# Patient Record
Sex: Female | Born: 1960 | Race: White | Hispanic: No | Marital: Married | Smoking: Never smoker
Health system: Southern US, Community
[De-identification: ages and names within clinical notes are randomized; demographics above are authoritative.]

## PROBLEM LIST (undated history)

## (undated) DIAGNOSIS — C801 Malignant (primary) neoplasm, unspecified: Secondary | ICD-10-CM

## (undated) DIAGNOSIS — K439 Ventral hernia without obstruction or gangrene: Secondary | ICD-10-CM

## (undated) HISTORY — PX: BREAST BIOPSY: SHX20

## (undated) HISTORY — PX: COLONOSCOPY: SHX174

## (undated) HISTORY — PX: DILATION AND CURETTAGE OF UTERUS: SHX78

---
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
## 2006-09-10 HISTORY — PX: EYE SURGERY: SHX253

## 2008-12-09 ENCOUNTER — Ambulatory Visit (HOSPITAL_COMMUNITY): Admission: RE | Admit: 2008-12-09 | Discharge: 2008-12-09 | Payer: Self-pay | Admitting: Obstetrics & Gynecology

## 2008-12-09 ENCOUNTER — Encounter (INDEPENDENT_AMBULATORY_CARE_PROVIDER_SITE_OTHER): Payer: Self-pay | Admitting: Obstetrics & Gynecology

## 2009-09-10 HISTORY — PX: DIAGNOSTIC LAPAROSCOPY: SUR761

## 2009-09-10 HISTORY — PX: OTHER SURGICAL HISTORY: SHX169

## 2009-11-18 ENCOUNTER — Ambulatory Visit (HOSPITAL_COMMUNITY): Admission: RE | Admit: 2009-11-18 | Discharge: 2009-11-18 | Payer: Self-pay | Admitting: Obstetrics & Gynecology

## 2010-08-10 HISTORY — PX: OTHER SURGICAL HISTORY: SHX169

## 2010-12-04 LAB — TYPE AND SCREEN
ABO/RH(D): B POS
Antibody Screen: NEGATIVE

## 2010-12-04 LAB — CBC
HCT: 40 % (ref 36.0–46.0)
Hemoglobin: 13.5 g/dL (ref 12.0–15.0)
MCHC: 33.8 g/dL (ref 30.0–36.0)
MCV: 92.4 fL (ref 78.0–100.0)
Platelets: 211 10*3/uL (ref 150–400)
WBC: 5.4 10*3/uL (ref 4.0–10.5)

## 2010-12-20 LAB — GLUCOSE, CAPILLARY: Glucose-Capillary: 82 mg/dL (ref 70–99)

## 2010-12-20 LAB — CBC
MCHC: 33.7 g/dL (ref 30.0–36.0)
MCV: 90.9 fL (ref 78.0–100.0)
Platelets: 239 10*3/uL (ref 150–400)
RDW: 13 % (ref 11.5–15.5)
WBC: 7.6 10*3/uL (ref 4.0–10.5)

## 2011-01-23 NOTE — Op Note (Signed)
NAME:  Tracie Hahn, Tracie Hahn           ACCOUNT NO.:  [id number]   MEDICAL RECORD NO.:  [id number]          PATIENT TYPE:  AMB   LOCATION:  SDC                           FACILITY:  [facility]   PHYSICIAN:  Genia Del, M.D.DATE OF BIRTH:  [birth year]   DATE OF PROCEDURE:  12/09/2008  DATE OF DISCHARGE:                               OPERATIVE REPORT   PREOPERATIVE DIAGNOSIS:  Menometrorrhagia and endometrial polyp.   POSTOPERATIVE DIAGNOSIS:  Menometrorrhagia and endometrial polyp.   PROCEDURE:  Hysteroscopy with resection and dilatation and curettage.   SURGEON:  Genia Del, MD   ASSISTANT:  None.   ANESTHESIOLOGIST:  Belva Agee, MD   PROCEDURE:  Under general anesthesia, the patient was in lithotomy  position.  She was prepped with SurgiCare on the suprapubic vulvar and  vaginal areas and draped as usual.  The bladder was catheterized.  The  vaginal exam under general anesthesia reveals an anteverted uterus,  normal volume, mobile, no adnexal mass.  We inserted the speculum into  the vagina.  The anterior lip of the cervix was grasped with a  tenaculum.  A paracervical block was done with lidocaine 1% a total of  20 mL at 4 and 8 o'clock.  We then dilated the cervix with Hegar  dilators up to #29 without difficulty.  We then inserted the operative  hysteroscope with the loop.  We inspected the intrauterine cavity.  We  noted a polyp on the left cornua.  No other intrauterine lesion.  We  used the loop to resect the polyp.  We then removed the hysteroscope.  We proceed with a systematic curettage of the endometrial cavity with a  sharp curette.  We sent that specimen with the resected polyp to  Pathology.  We then go back in with the hysteroscope and visualized the  intrauterine cavity.  Hemostasis was completed with coagulation with the  loop towards the left cornua.  Hemostasis was adequate at all levels.  The cavity looks normal.  Pictures were taken before and after  resection.  We removed the instruments.  The tenaculum was removed from  the cervix.  Hemostasis was adequate at that level as well.  We then  removed the speculum.  The estimated blood loss was minimal.  The fluid  deficit was 35 mL.  No complications occurred, and the patient was  brought to recovery room in good stable status.      Genia Del, M.D.  Electronically Signed     ML/MEDQ  D:  12/09/2008  T:  12/10/2008  Job:  [id number]

## 2011-01-29 ENCOUNTER — Other Ambulatory Visit: Payer: Self-pay | Admitting: Obstetrics & Gynecology

## 2012-09-10 HISTORY — PX: BREAST BIOPSY: SHX20

## 2012-09-10 HISTORY — PX: BREAST SURGERY: SHX581

## 2013-09-10 HISTORY — PX: ABDOMINAL HYSTERECTOMY: SHX81

## 2013-12-24 ENCOUNTER — Other Ambulatory Visit: Payer: Self-pay | Admitting: Obstetrics & Gynecology

## 2013-12-24 DIAGNOSIS — R921 Mammographic calcification found on diagnostic imaging of breast: Secondary | ICD-10-CM

## 2014-01-11 ENCOUNTER — Ambulatory Visit
Admission: RE | Admit: 2014-01-11 | Discharge: 2014-01-11 | Disposition: A | Discharge status: Home / Self Care | Payer: BC Managed Care – PPO | Source: Ambulatory Visit | Attending: Obstetrics & Gynecology | Admitting: Obstetrics & Gynecology

## 2014-01-11 DIAGNOSIS — R921 Mammographic calcification found on diagnostic imaging of breast: Secondary | ICD-10-CM

## 2014-04-27 ENCOUNTER — Encounter (HOSPITAL_COMMUNITY): Payer: Self-pay | Admitting: *Deleted

## 2014-05-03 ENCOUNTER — Other Ambulatory Visit: Payer: Self-pay | Admitting: Obstetrics & Gynecology

## 2014-05-04 ENCOUNTER — Encounter (HOSPITAL_COMMUNITY): Payer: Self-pay | Admitting: Pharmacist

## 2014-05-07 ENCOUNTER — Encounter (HOSPITAL_COMMUNITY): Payer: BC Managed Care – PPO | Admitting: Anesthesiology

## 2014-05-07 ENCOUNTER — Ambulatory Visit (HOSPITAL_COMMUNITY)
Admission: RE | Admit: 2014-05-07 | Discharge: 2014-05-07 | Disposition: A | Discharge status: Home / Self Care | Payer: BC Managed Care – PPO | Source: Ambulatory Visit | Attending: Obstetrics & Gynecology | Admitting: Obstetrics & Gynecology

## 2014-05-07 ENCOUNTER — Encounter (HOSPITAL_COMMUNITY): Payer: Self-pay | Admitting: Anesthesiology

## 2014-05-07 ENCOUNTER — Ambulatory Visit (HOSPITAL_COMMUNITY): Payer: BC Managed Care – PPO | Admitting: Anesthesiology

## 2014-05-07 ENCOUNTER — Encounter (HOSPITAL_COMMUNITY)
Admission: RE | Disposition: A | Discharge status: Home / Self Care | Payer: Self-pay | Source: Ambulatory Visit | Attending: Obstetrics & Gynecology

## 2014-05-07 DIAGNOSIS — D251 Intramural leiomyoma of uterus: Secondary | ICD-10-CM | POA: Insufficient documentation

## 2014-05-07 DIAGNOSIS — D250 Submucous leiomyoma of uterus: Secondary | ICD-10-CM | POA: Insufficient documentation

## 2014-05-07 HISTORY — PX: DILITATION & CURRETTAGE/HYSTROSCOPY WITH VERSAPOINT RESECTION: SHX5571

## 2014-05-07 LAB — CBC
HCT: 38.2 % (ref 36.0–46.0)
HEMOGLOBIN: 13.5 g/dL (ref 12.0–15.0)
MCH: 31.3 pg (ref 26.0–34.0)
MCHC: 35.3 g/dL (ref 30.0–36.0)
MCV: 88.6 fL (ref 78.0–100.0)
Platelets: 182 10*3/uL (ref 150–400)
RBC: 4.31 MIL/uL (ref 3.87–5.11)
RDW: 13.2 % (ref 11.5–15.5)
WBC: 5.5 10*3/uL (ref 4.0–10.5)

## 2014-05-07 SURGERY — DILATATION & CURETTAGE/HYSTEROSCOPY WITH VERSAPOINT RESECTION
Anesthesia: General

## 2014-05-07 MED ORDER — SCOPOLAMINE 1 MG/3DAYS TD PT72
1.0000 | MEDICATED_PATCH | Freq: Once | TRANSDERMAL | Status: DC
  Administered 2014-05-07: 1.5 mg via TRANSDERMAL

## 2014-05-07 MED ORDER — PROPOFOL 10 MG/ML IV EMUL
INTRAVENOUS | Status: AC
  Filled 2014-05-07: qty 20

## 2014-05-07 MED ORDER — KETOROLAC TROMETHAMINE 30 MG/ML IJ SOLN
INTRAMUSCULAR | Status: AC
  Filled 2014-05-07: qty 1

## 2014-05-07 MED ORDER — LIDOCAINE HCL (CARDIAC) 20 MG/ML IV SOLN
INTRAVENOUS | Status: AC
Start: 2014-05-07 — End: 2014-05-07
  Filled 2014-05-07: qty 5

## 2014-05-07 MED ORDER — CEFAZOLIN SODIUM-DEXTROSE 2-3 GM-% IV SOLR
INTRAVENOUS | Status: AC
  Filled 2014-05-07: qty 50

## 2014-05-07 MED ORDER — MEPERIDINE HCL 25 MG/ML IJ SOLN: 6.2500 mg | INTRAMUSCULAR | Status: DC | PRN

## 2014-05-07 MED ORDER — PROMETHAZINE HCL 25 MG/ML IJ SOLN: 6.2500 mg | INTRAMUSCULAR | Status: DC | PRN

## 2014-05-07 MED ORDER — MIDAZOLAM HCL 2 MG/2ML IJ SOLN
INTRAMUSCULAR | Status: DC | PRN
Start: 2014-05-07 — End: 2014-05-07
  Administered 2014-05-07: 2 mg via INTRAVENOUS

## 2014-05-07 MED ORDER — SCOPOLAMINE 1 MG/3DAYS TD PT72
MEDICATED_PATCH | TRANSDERMAL | Status: AC
  Administered 2014-05-07: 1.5 mg via TRANSDERMAL
  Filled 2014-05-07: qty 1

## 2014-05-07 MED ORDER — ONDANSETRON HCL 4 MG/2ML IJ SOLN
INTRAMUSCULAR | Status: DC | PRN
  Administered 2014-05-07: 4 mg via INTRAVENOUS

## 2014-05-07 MED ORDER — DEXAMETHASONE SODIUM PHOSPHATE 10 MG/ML IJ SOLN
INTRAMUSCULAR | Status: DC | PRN
  Administered 2014-05-07: 5 mg via INTRAVENOUS

## 2014-05-07 MED ORDER — LACTATED RINGERS IV SOLN
INTRAVENOUS | Status: DC
  Administered 2014-05-07 (×3): via INTRAVENOUS

## 2014-05-07 MED ORDER — MIDAZOLAM HCL 2 MG/2ML IJ SOLN
INTRAMUSCULAR | Status: AC
  Filled 2014-05-07: qty 2

## 2014-05-07 MED ORDER — PROPOFOL 10 MG/ML IV BOLUS
INTRAVENOUS | Status: DC | PRN
  Administered 2014-05-07: 150 mg via INTRAVENOUS
  Administered 2014-05-07: 20 mg via INTRAVENOUS

## 2014-05-07 MED ORDER — CHLOROPROCAINE HCL 1 % IJ SOLN
INTRAMUSCULAR | Status: DC | PRN
  Administered 2014-05-07: 20 mL

## 2014-05-07 MED ORDER — FENTANYL CITRATE 0.05 MG/ML IJ SOLN
INTRAMUSCULAR | Status: AC
  Filled 2014-05-07: qty 2

## 2014-05-07 MED ORDER — CHLOROPROCAINE HCL 1 % IJ SOLN
INTRAMUSCULAR | Status: AC
  Filled 2014-05-07: qty 30

## 2014-05-07 MED ORDER — CEFAZOLIN SODIUM-DEXTROSE 2-3 GM-% IV SOLR
2.0000 g | INTRAVENOUS | Status: AC
  Administered 2014-05-07: 2 g via INTRAVENOUS

## 2014-05-07 MED ORDER — DEXAMETHASONE SODIUM PHOSPHATE 10 MG/ML IJ SOLN
INTRAMUSCULAR | Status: AC
  Filled 2014-05-07: qty 1

## 2014-05-07 MED ORDER — FENTANYL CITRATE 0.05 MG/ML IJ SOLN: 25.0000 ug | INTRAMUSCULAR | Status: DC | PRN

## 2014-05-07 MED ORDER — LIDOCAINE HCL (CARDIAC) 20 MG/ML IV SOLN
INTRAVENOUS | Status: DC | PRN
  Administered 2014-05-07: 30 mg via INTRAVENOUS

## 2014-05-07 MED ORDER — KETOROLAC TROMETHAMINE 30 MG/ML IJ SOLN: 15.0000 mg | Freq: Once | INTRAMUSCULAR | Status: DC | PRN

## 2014-05-07 MED ORDER — FENTANYL CITRATE 0.05 MG/ML IJ SOLN
INTRAMUSCULAR | Status: DC | PRN
  Administered 2014-05-07: 25 ug via INTRAVENOUS
  Administered 2014-05-07: 50 ug via INTRAVENOUS
  Administered 2014-05-07 (×2): 25 ug via INTRAVENOUS

## 2014-05-07 MED ORDER — KETOROLAC TROMETHAMINE 30 MG/ML IJ SOLN
INTRAMUSCULAR | Status: DC | PRN
  Administered 2014-05-07: 30 mg via INTRAVENOUS

## 2014-05-07 MED ORDER — ONDANSETRON HCL 4 MG/2ML IJ SOLN
INTRAMUSCULAR | Status: AC
  Filled 2014-05-07: qty 2

## 2014-05-07 MED ORDER — OXYCODONE-ACETAMINOPHEN 7.5-325 MG PO TABS: 1.0000 | ORAL_TABLET | ORAL | Status: DC | PRN

## 2014-05-07 SURGICAL SUPPLY — 23 items
CANISTER SUCT 3000ML (MISCELLANEOUS) ×2 IMPLANT
CATH ROBINSON RED A/P 16FR (CATHETERS) ×2 IMPLANT
CLOTH BEACON ORANGE TIMEOUT ST (SAFETY) ×2 IMPLANT
CONTAINER PREFILL 10% NBF 60ML (FORM) ×4 IMPLANT
DRAPE HYSTEROSCOPY (DRAPE) ×2 IMPLANT
DRSG TELFA 3X8 NADH (GAUZE/BANDAGES/DRESSINGS) ×2 IMPLANT
ELECTRODE ROLLER VERSAPOINT (ELECTRODE) ×2 IMPLANT
ELECTRODE RT ANGLE VERSAPOINT (CUTTING LOOP) IMPLANT
GLOVE BIO SURGEON STRL SZ 6.5 (GLOVE) ×2 IMPLANT
GLOVE BIOGEL PI IND STRL 7.0 (GLOVE) ×2 IMPLANT
GLOVE BIOGEL PI INDICATOR 7.0 (GLOVE) ×2
GOWN STRL REUS W/TWL LRG LVL3 (GOWN DISPOSABLE) ×4 IMPLANT
NDL SPNL 22GX3.5 QUINCKE BK (NEEDLE) ×1 IMPLANT
NEEDLE SPNL 22GX3.5 QUINCKE BK (NEEDLE) ×2 IMPLANT
PACK VAGINAL MINOR WOMEN LF (CUSTOM PROCEDURE TRAY) ×2 IMPLANT
PAD DRESSING TELFA 3X8 NADH (GAUZE/BANDAGES/DRESSINGS) ×1 IMPLANT
PAD OB MATERNITY 4.3X12.25 (PERSONAL CARE ITEMS) ×2 IMPLANT
SET TUBING HYSTEROSCOPY 2 NDL (TUBING) IMPLANT
SYRINGE CONTROL L 12CC (SYRINGE) ×2 IMPLANT
SYRINGE CONTROL LL 12CC (SYRINGE) ×1 IMPLANT
TOWEL OR 17X24 6PK STRL BLUE (TOWEL DISPOSABLE) ×4 IMPLANT
TUBE HYSTEROSCOPY W Y-CONNECT (TUBING) IMPLANT
WATER STERILE IRR 1000ML POUR (IV SOLUTION) ×2 IMPLANT

## 2014-05-07 NOTE — Discharge Instructions (Signed)
Hysteroscopy, Care After °Refer to this sheet in the next few weeks. These instructions provide you with information on caring for yourself after your procedure. Your health care provider may also give you more specific instructions. Your treatment has been planned according to current medical practices, but problems sometimes occur. Call your health care provider if you have any problems or questions after your procedure.  °WHAT TO EXPECT AFTER THE PROCEDURE °After your procedure, it is typical to have the following: °· You may have some cramping. This normally lasts for a couple days. °· You may have bleeding. This can vary from light spotting for a few days to menstrual-like bleeding for 3-7 days. °HOME CARE INSTRUCTIONS °· Rest for the first 1-2 days after the procedure. °· Only take over-the-counter or prescription medicines as directed by your health care provider. Do not take aspirin. It can increase the chances of bleeding. °· Take showers instead of baths for 2 weeks or as directed by your health care provider. °· Do not drive for 24 hours or as directed. °· Do not drink alcohol while taking pain medicine. °· Do not use tampons, douche, or have sexual intercourse for 2 weeks or until your health care provider says it is okay. °· Take your temperature twice a day for 4-5 days. Write it down each time. °· Follow your health care provider's advice about diet, exercise, and lifting. °· If you develop constipation, you may: °¨ Take a mild laxative if your health care provider approves. °¨ Add bran foods to your diet. °¨ Drink enough fluids to keep your urine clear or pale yellow. °· Try to have someone with you or available to you for the first 24-48 hours, especially if you were given a general anesthetic. °· Follow up with your health care provider as directed. °SEEK MEDICAL CARE IF: °· You feel dizzy or lightheaded. °· You feel sick to your stomach (nauseous). °· You have abnormal vaginal discharge. °· You  have a rash. °· You have pain that is not controlled with medicine. °SEEK IMMEDIATE MEDICAL CARE IF: °· You have bleeding that is heavier than a normal menstrual period. °· You have a fever. °· You have increasing cramps or pain, not controlled with medicine. °· You have new belly (abdominal) pain. °· You pass out. °· You have pain in the tops of your shoulders (shoulder strap areas). °· You have shortness of breath. °Document Released: 06/17/2013 Document Reviewed: 06/17/2013 °Exit[employer]® Patient Information ©2015 [employer]. This information is not intended to replace advice given to you by your health care provider. Make sure you discuss any questions you have with your health care provider. ° °

## 2014-05-07 NOTE — Anesthesia Postprocedure Evaluation (Signed)
  Anesthesia Post-op Note  Anesthesia Post Note  Patient: Tracie Hahn  Procedure(s) Performed: Procedure(s) (LRB): DILATATION & CURETTAGE/HYSTEROSCOPY WITH VERSAPOINT RESECTION (N/A)  Anesthesia type: General  Patient location: PACU  Post pain: Pain level controlled  Post assessment: Post-op Vital signs reviewed  Last Vitals:  Filed Vitals:   05/07/14 1345  BP: 117/78  Pulse: 65  Temp:   Resp: 20    Post vital signs: Reviewed  Level of consciousness: sedated  Complications: No apparent anesthesia complications

## 2014-05-07 NOTE — Anesthesia Preprocedure Evaluation (Signed)
Anesthesia Evaluation  Patient identified by MRN, date of birth, ID band Patient awake    Reviewed: Allergy & Precautions, H&P , NPO status , Patient's Chart, lab work & pertinent test results, reviewed documented beta blocker date and time   History of Anesthesia Complications (+) history of anesthetic complications (h/o sensitivity to medications, h/o motion sickness)  Airway Mallampati: I TM Distance: >3 FB Neck ROM: full    Dental  (+) Teeth Intact   Pulmonary neg pulmonary ROS,  breath sounds clear to auscultation  Pulmonary exam normal       Cardiovascular Exercise Tolerance: Good Rhythm:regular Rate:Normal     Neuro/Psych negative neurological ROS  negative psych ROS   GI/Hepatic negative GI ROS, Neg liver ROS,   Endo/Other  negative endocrine ROS  Renal/GU negative Renal ROS  Female GU complaint     Musculoskeletal   Abdominal   Peds  Hematology negative hematology ROS (+)   Anesthesia Other Findings   Reproductive/Obstetrics negative OB ROS                           Anesthesia Physical Anesthesia Plan  ASA: I  Anesthesia Plan: General LMA   Post-op Pain Management:    Induction:   Airway Management Planned:   Additional Equipment:   Intra-op Plan:   Post-operative Plan:   Informed Consent: I have reviewed the patients History and Physical, chart, labs and discussed the procedure including the risks, benefits and alternatives for the proposed anesthesia with the patient or authorized representative who has indicated his/her understanding and acceptance.   Dental Advisory Given  Plan Discussed with: CRNA and Surgeon  Anesthesia Plan Comments:         Anesthesia Quick Evaluation

## 2014-05-07 NOTE — Transfer of Care (Signed)
Immediate Anesthesia Transfer of Care Note  Patient: Tracie Hahn  Procedure(s) Performed: Procedure(s): DILATATION & CURETTAGE/HYSTEROSCOPY WITH VERSAPOINT RESECTION (N/A)  Patient Location: PACU  Anesthesia Type:General  Level of Consciousness: awake and alert   Airway & Oxygen Therapy: Patient Spontanous Breathing and Patient connected to nasal cannula oxygen  Post-op Assessment: Report given to PACU RN and Post -op Vital signs reviewed and stable  Post vital signs: Reviewed and stable  Complications: No apparent anesthesia complications

## 2014-05-07 NOTE — H&P (Signed)
Tracie Hahn is an 53 y.o. female   RP:  IU lesions  Pertinent Gynecological History: Bleeding: Irregular Contraception: abstinence, same sex partner Blood transfusions: none Sexually transmitted diseases: no past history Previous GYN Procedures: [place] resection/D+C  Last mammogram: normal  Last pap: normal  Menstrual History:  No LMP recorded. Patient is postmenopausal.    No past medical history on file.  Past Surgical History  Procedure Laterality Date  . Wisdom tooth extraction    . Eye surgery      lasik -bilateral  . Breast surgery      right breast biopsy  . Colonoscopy    . Dilation and curettage of uterus  2010  . Diagnostic laparoscopy  2011    right ovary removed  . Left elbow surgery  08/2010    No family history on file.  Social History:  reports that she has never smoked. She does not have any smokeless tobacco history on file. She reports that she drinks alcohol. She reports that she does not use illicit drugs.  Allergies:  Allergies  Allergen Reactions  . Shellfish Allergy     Itching, swelling, n/v    Prescriptions prior to admission  Medication Sig Dispense Refill  . acetaminophen (TYLENOL) 500 MG tablet Take 500 mg by mouth every 6 (six) hours as needed for moderate pain or headache.      . cycloSPORINE (RESTASIS) 0.05 % ophthalmic emulsion Place 1 drop into both eyes daily.      Marland Kitchen ibuprofen (ADVIL,MOTRIN) 200 MG tablet Take 600 mg by mouth every 6 (six) hours as needed for headache or moderate pain.        ROS  Blood pressure 114/79, pulse 59, temperature 98.4 F (36.9 C), temperature source Oral, resp. rate 16, height 5\' 9"  (1.753 m), weight 81.647 kg (180 lb), SpO2 98.00%. Physical Exam  Sonohysto:  IU lesions c/w polyps or SM myoma  Results for orders placed during the hospital encounter of 05/07/14 (from the past 24 hour(s))  CBC     Status: None   Collection Time    05/07/14  8:50 AM      Result Value Ref Range   WBC 5.5   4.0 - 10.5 K/uL   RBC 4.31  3.87 - 5.11 MIL/uL   Hemoglobin 13.5  12.0 - 15.0 g/dL   HCT 38.2  36.0 - 46.0 %   MCV 88.6  78.0 - 100.0 fL   MCH 31.3  26.0 - 34.0 pg   MCHC 35.3  30.0 - 36.0 g/dL   RDW 13.2  11.5 - 15.5 %   Platelets 182  150 - 400 K/uL    No results found.  Assessment/Plan: IU lesions for [place] resection, D+C.  Surgery and risks reviewed.  Kaicee Scarpino,MARIE-LYNE 05/07/2014, 10:41 AM

## 2014-05-07 NOTE — Discharge Summary (Signed)
  Physician Discharge Summary  Patient ID: Tracie Hahn MRN: [id number] DOB/AGE: [birth year] 52 y.o.  Admit date: 05/07/2014 Discharge date: 05/07/2014  Admission Diagnoses: Intrauterine Polyp vs Myoma  Discharge Diagnoses: Submucosal myomas        Active Problems:   * No active hospital problems. *   Discharged Condition: good  Hospital Course: Outpatient  Consults: None  Treatments: surgery: Hysteroscopy with Versapoint resection and D+C  Disposition: Final discharge disposition not confirmed     Medication List         acetaminophen 500 MG tablet  Commonly known as:  TYLENOL  Take 500 mg by mouth every 6 (six) hours as needed for moderate pain or headache.     cycloSPORINE 0.05 % ophthalmic emulsion  Commonly known as:  RESTASIS  Place 1 drop into both eyes daily.     ibuprofen 200 MG tablet  Commonly known as:  ADVIL,MOTRIN  Take 600 mg by mouth every 6 (six) hours as needed for headache or moderate pain.     oxyCODONE-acetaminophen 7.5-325 MG per tablet  Commonly known as:  PERCOCET  Take 1 tablet by mouth every 4 (four) hours as needed for pain.           Follow-up Information   Follow up with Tarvares Lant,MARIE-LYNE, MD In 3 weeks.   Specialty:  Obstetrics and Gynecology   Contact information:   [address] [place] [address] [phone]       Signed: Princess Bruins, MD 05/07/2014, 11:57 AM

## 2014-05-07 NOTE — Op Note (Signed)
05/07/2014  11:58 AM  PATIENT:  Tracie Hahn  53 y.o. female  PRE-OPERATIVE DIAGNOSIS:  Intrauterine Polyp vs Myoma  POST-OPERATIVE DIAGNOSIS:  Submucosal Myomas  PROCEDURE:  Procedure(s): DILATATION & CURETTAGE/HYSTEROSCOPY WITH VERSAPOINT RESECTION  SURGEON:  Surgeon(s): Princess Bruins, MD  ASSISTANTS: none   ANESTHESIA:   general  PROCEDURE:  Under general anesthesia with laryngeal mask the patient is in lithotomy position. She is prepped with Betadine on the suprapubic, vulvar and vaginal areas. She is draped as usual. The bladder is catheterized. We insert the speculum in the vagina and grasped the anterior lip of the cervix with a tenaculum. The hysterometry is at 8 cm. We proceed with dilation of the cervix with Hegar dilators up to #31 without difficulty. We then inserted the operative hysteroscope with the VersaPoint instrument connected. We inspect the intrauterine cavity and note a large submucosal myoma on the posterior aspect of the cavity and a second one in the right fundal area. Those 2 fibroids are partially intramural. We used the VersaPoint to resect both to a depth that was  under the endometrial level.  Both ostia were well seen. The cavity was normal in appearance after resection. We then proceeded with a curettage of the intrauterine cavity on all surfaces with a sharp curet. We inserted the hysteroscope again and confirmed good hemostasis.  Pictures were taken before and after the resection.  All instruments were removed. The patient was brought to recovery room in good and stable status.  ESTIMATED BLOOD LOSS:  25 cc  Fluid deficit: 130 cc   Intake/Output Summary (Last 24 hours) at 05/07/14 1158 Last data filed at 05/07/14 1144  Gross per 24 hour  Intake   1000 ml  Output      0 ml  Net   1000 ml     BLOOD ADMINISTERED:none   LOCAL MEDICATIONS USED:  Amount: 20 ml of Nesacaine  SPECIMEN:  Source of Specimen:  Myoma and  endometrium  DISPOSITION OF SPECIMEN:  PATHOLOGY  COUNTS:  YES  PLAN OF CARE: Transfer to PACU  Princess Bruins MD  05/07/2014  At 11:59 am

## 2014-05-10 ENCOUNTER — Encounter (HOSPITAL_COMMUNITY): Payer: Self-pay | Admitting: Obstetrics & Gynecology

## 2014-05-24 ENCOUNTER — Encounter: Payer: Self-pay | Admitting: Gynecologic Oncology

## 2014-05-24 ENCOUNTER — Ambulatory Visit: Payer: BC Managed Care – PPO | Attending: Gynecologic Oncology | Admitting: Gynecologic Oncology

## 2014-05-24 VITALS — BP 126/85 | HR 66 | Temp 99.5°F | Resp 22 | Ht 69.0 in | Wt 180.0 lb

## 2014-05-24 DIAGNOSIS — N8502 Endometrial intraepithelial neoplasia [EIN]: Secondary | ICD-10-CM | POA: Diagnosis present

## 2014-05-24 DIAGNOSIS — Z8041 Family history of malignant neoplasm of ovary: Secondary | ICD-10-CM | POA: Diagnosis not present

## 2014-05-24 DIAGNOSIS — N939 Abnormal uterine and vaginal bleeding, unspecified: Secondary | ICD-10-CM

## 2014-05-24 DIAGNOSIS — Z803 Family history of malignant neoplasm of breast: Secondary | ICD-10-CM | POA: Diagnosis not present

## 2014-05-24 DIAGNOSIS — N898 Other specified noninflammatory disorders of vagina: Secondary | ICD-10-CM

## 2014-05-24 NOTE — Progress Notes (Signed)
Consult Note: Gyn-Onc  Consult was requested by Dr. Dellis Filbert for the evaluation of Tracie Hahn 53 y.o. 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  CC: Dr Dellis Filbert Chief Complaint  Patient presents with  . Bleeding/Bruising    Assessment/Plan:  [name]  is a 53 y.o.  year old woman who is seen in consultation at the request of Dr Dellis Filbert for CAH/grade 1 endometrioid endometrial cancer.   A detailed discussion was held with the patient and her family with regard to to her CAH/possible endometrial cancer diagnosis. We discussed the standard management options for CAH which includes surgery (hysterectomy, possible BSO) with intraoperative frozen section and consideration for lymphadenectomy if risk factors for metastatic disease are identified (tumor >2cm, grade 2 or 3 tumor, deep myometrial invasion). I discussed that if invasive cancer is identified on frozen and/or postoperative pathology, we would make decisions about adjuvant therapy depending on the results of surgery and final stage assignment. The options for surgical management include a hysterectomy and removal of the left tube and ovary possibly with removal of pelvic and para-aortic lymph nodes. We discussed the possibility of preserving her left ovary in the frozen section reveals only CAH. We discussed data suggesting that there may be some improved long term survival among women (without a cancer diagnosis) who have ovarian preservation prior to age 71. We discussed the 20% risk for error in frozen section (up grading/staging the lesion on permanent pathology). Her family history is for BRCA 1 & 2 germline mutations, however, her mother tested negative for these and is cancer free. We discussed that with her young age and minimal risk factors for uterine cancer (nulliparous, annovulatory cycles) she is considered at increased risk for carrying a genetic  predisposition to cancer (eg Lynch syndrome) which is not related to there BRCA mutation, and would increase her risk for ovarian cancer above baseline risk. She will consider options regarding routine oophorectomy vs reflex oophorectomy if definitive cancer is identified and inform us prior to or on her surgical date.  We discussed operative approach, and discussed that minimally invasive surgery including a robotic hysterectomy or laparoscopic hysterectomy have benefits including shorter hospital stay, recovery time and better wound healing. The alternative approach is an open hysterectomy. The patient has been counseled about these surgical options and the risks of surgery in general including infection, bleeding, damage to surrounding structures (including bowel, bladder, ureters, nerves or vessels), and the postoperative risks of PE/ DVT, and lymphedema. I extensively reviewed the additional risks of robotic hysterectomy including possible need for conversion to open laparotomy.  I discussed positioning during surgery of trendelenberg and risks of minor facial swelling and care we take in preoperative positioning.  After counseling and consideration of her options, she desires to proceed with robotic hysterectomy, possible LSO, possible lymphadenectomy.   She will be seen for preoperative clearance and discussion of postoperative pain management.  She was given the opportunity to ask questions, which were answered to her satisfaction, and she is agreement with the above mentioned plan of care.    HPI:  Tracie Hahn is a 53 year old, very healthy G0 who has a diagnosis of CAH/possible grade 1 endometrial cancer on D&C.  She reports 4 years of oligomenorrhagia and perimenopausal symptoms (hot flashes). In August 2015 she underwent ultrasound which revealed a thickened (1.5cm) endometrial stripe. She underwent D&C on 05/07/14. Pathology revealed complex atypical hyperplasia with foci suspicious for  well differentiated  endometrioid adenocarcinoma.   She is nulliparous, thin, active, with no history of diabetes. She has been taking intermittent progesterones for the past 2 months to regulate her bleeding and treat her hyperplasia.  She had a somewhat normal period last week.  She has a very strong maternal history for breast and ovarian cancer which includes: Grandmother with ovarian cancer age 31 Uncle with female breast cancer age 78 (BRCA 1 and 2 mutation positive) Aunt with BRCA positive breast and ovarian cancer (ages 21 and 38). Her mother was tested for BRCA deleterious mutations and was negative. She is alive and cancer free.  There is no family history for colon cancer.   Interval History: She had a period with was light and normal last week.  Current Meds:  Outpatient Encounter Prescriptions as of 05/24/2014  Medication Sig  . acetaminophen (TYLENOL) 500 MG tablet Take 500 mg by mouth every 6 (six) hours as needed for moderate pain or headache.  . cycloSPORINE (RESTASIS) 0.05 % ophthalmic emulsion Place 1 drop into both eyes daily.  Marland Kitchen ibuprofen (ADVIL,MOTRIN) 200 MG tablet Take 600 mg by mouth every 6 (six) hours as needed for headache or moderate pain.  Marland Kitchen norethindrone (AYGESTIN) 5 MG tablet   . progesterone (PROMETRIUM) 200 MG capsule   . oxyCODONE-acetaminophen (PERCOCET) 7.5-325 MG per tablet Take 1 tablet by mouth every 4 (four) hours as needed for pain.    Allergy:  Allergies  Allergen Reactions  . Shellfish Allergy     Itching, swelling, n/v    Social Hx:   History   Social History  . Marital Status: Single    Spouse Name: N/A    Number of Children: N/A  . Years of Education: N/A   Occupational History  . Not on file.   Social History Main Topics  . Smoking status: Never Smoker   . Smokeless tobacco: Not on file  . Alcohol Use: Yes     Comment: 3-4  . Drug Use: No  . Sexual Activity: Not on file   Other Topics Concern  . Not on file   Social  History Narrative  . No narrative on file    Past Surgical Hx:  Past Surgical History  Procedure Laterality Date  . Wisdom tooth extraction    . Eye surgery      lasik -bilateral  . Breast surgery      right breast biopsy  . Colonoscopy    . Dilation and curettage of uterus  2010  . Diagnostic laparoscopy  2011    right ovary removed  . Left elbow surgery  08/2010  . Dilitation & currettage/hystroscopy with versapoint resection N/A 05/07/2014    Procedure: DILATATION & CURETTAGE/HYSTEROSCOPY WITH VERSAPOINT RESECTION;  Surgeon: Princess Bruins, MD;  Location: [facility];  Service: Gynecology;  Laterality: N/A;    Past Medical Hx: History reviewed. No pertinent past medical history.  Past Gynecological History:  LMP 05/17/14, G0. S/p laparoscopic RSO for benign ovarian cyst.  No LMP recorded. Patient is postmenopausal.  Family Hx:  Family History  Problem Relation Age of Onset  . Cancer Maternal Aunt 40    BRCA positive breast and ovarian  . Cancer Maternal Uncle 53    BRCA 1 & 2 positive female breast and prostate cancer  . Cancer Maternal Grandmother 52    ovarian cancer    Review of Systems:  Constitutional  Feels well,    ENT Normal appearing ears and nares bilaterally Skin/Breast  No rash, sores, jaundice,  itching, dryness Cardiovascular  No chest pain, shortness of breath, or edema  Pulmonary  No cough or wheeze.  Gastro Intestinal  No nausea, vomitting, or diarrhoea. No bright red blood per rectum, no abdominal pain, change in bowel movement, or constipation.  Genito Urinary  No frequency, urgency, dysuria, see HPI Musculo Skeletal  No myalgia, arthralgia, joint swelling or pain  Neurologic  No weakness, numbness, change in gait,  Psychology  No depression, anxiety, insomnia.   Vitals:  Blood pressure 126/85, pulse 66, temperature 99.5 F (37.5 C), temperature source Oral, resp. rate 22, height 5' 9" (1.753 m), weight 180 lb (81.647 kg).  Physical  Exam: WD in NAD Neck  Supple NROM, without any enlargements.  Lymph Node Survey No cervical supraclavicular or inguinal adenopathy Cardiovascular  Pulse normal rate, regularity and rhythm. S1 and S2 normal.  Lungs  Clear to auscultation bilateraly, without wheezes/crackles/rhonchi. Good air movement.  Skin  No rash/lesions/breakdown  Psychiatry  Alert and oriented to person, place, and time  Abdomen  Normoactive bowel sounds, abdomen soft, non-tender and thin without evidence of hernia.  Back No CVA tenderness Genito Urinary  Vulva/vagina: Normal external female genitalia.   No lesions. No discharge or bleeding.  Bladder/urethra:  No lesions or masses, well supported bladder  Vagina: normal, no lesions  Cervix: Normal appearing, no lesions.  Uterus:  Small, mobile, no parametrial involvement or nodularity.  Adnexa: no palpable masses. Rectal  Good tone, no masses no cul de sac nodularity.  Extremities  No bilateral cyanosis, clubbing or edema.   Donaciano Eva, MD   05/24/2014, 4:39 PM

## 2014-05-24 NOTE — Patient Instructions (Addendum)
Plan for surgery at [facility] with Dr. Alycia Rossetti on October 2.  Pre-op appointment will be at [facility] on Thursday, Sept 17 at 11:30am.

## 2014-06-12 DIAGNOSIS — Z90710 Acquired absence of both cervix and uterus: Secondary | ICD-10-CM | POA: Insufficient documentation

## 2014-07-14 ENCOUNTER — Ambulatory Visit: Payer: BC Managed Care – PPO | Attending: Gynecologic Oncology | Admitting: Gynecologic Oncology

## 2014-07-14 ENCOUNTER — Encounter: Payer: Self-pay | Admitting: Gynecologic Oncology

## 2014-07-14 VITALS — BP 119/76 | HR 69 | Temp 98.5°F | Resp 18 | Ht 69.0 in | Wt 178.0 lb

## 2014-07-14 DIAGNOSIS — Z8041 Family history of malignant neoplasm of ovary: Secondary | ICD-10-CM | POA: Diagnosis not present

## 2014-07-14 DIAGNOSIS — C541 Malignant neoplasm of endometrium: Secondary | ICD-10-CM

## 2014-07-14 DIAGNOSIS — Z90710 Acquired absence of both cervix and uterus: Secondary | ICD-10-CM | POA: Diagnosis not present

## 2014-07-14 DIAGNOSIS — N8502 Endometrial intraepithelial neoplasia [EIN]: Secondary | ICD-10-CM | POA: Diagnosis present

## 2014-07-14 DIAGNOSIS — Z90722 Acquired absence of ovaries, bilateral: Secondary | ICD-10-CM | POA: Diagnosis not present

## 2014-07-14 DIAGNOSIS — Z8542 Personal history of malignant neoplasm of other parts of uterus: Secondary | ICD-10-CM | POA: Insufficient documentation

## 2014-07-14 DIAGNOSIS — Z9079 Acquired absence of other genital organ(s): Secondary | ICD-10-CM | POA: Insufficient documentation

## 2014-07-14 NOTE — Progress Notes (Signed)
Consult Note: Gyn-Onc  Consult was requested by Dr. Seymour Bars for the evaluation of Tracie Hahn 53 y.o. 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  CC: Dr Seymour Bars Chief Complaint  Patient presents with  . Complex atypical endometrial hyperplasia    Assessment/Plan:  [name]  is a 53 y.o.  year old woman who is seen in consultation at the request of Dr Seymour Bars for CAH/grade 1 endometrioid endometrial cancer. Stage/Disposition: [name] is a 53 y.o. 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 Disposition to close follow up. Tumor should undergo MSI/IHC testing for Lynch syndrome. Of note, mother had BRCA testing that was negative, thus BRCA testing not indicated for this patient. She is doing very well postoperatively. She'll return to see me in 6 months.  HPI:  Tracie Hahn is a 53 year old, very healthy G0 who has a diagnosis of CAH/possible grade 1 endometrial cancer on D&C.  She reports 4 years of oligomenorrhagia and perimenopausal symptoms (hot flashes). In August 2015 she underwent ultrasound which revealed a thickened (1.5cm) endometrial stripe. She underwent D&C on 05/07/14. Pathology revealed complex atypical hyperplasia with foci suspicious for well differentiated endometrioid adenocarcinoma.   She is nulliparous, thin, active, with no history of diabetes. She has been taking intermittent progesterones for the past 2 months to regulate her bleeding and treat her hyperplasia.  She had a somewhat normal period last week.  She has a very strong maternal history for breast and ovarian cancer which includes: Grandmother with ovarian cancer age 63 Uncle with female breast cancer age 54 (BRCA 1 and 2 mutation positive) Aunt with BRCA positive breast and ovarian cancer (ages 65 and 38). Her mother was tested for BRCA deleterious mutations and was  negative. She is alive and cancer free.  There is no family history for colon cancer.   Interval History:  Oncology Summary:    Endometrial cancer (RAF-HCC)    06/11/2014  Initial Diagnosis  Endometrial cancer (RAF-HCC)    06/11/2014  Surgery  Robotic-assisted total laparoscopic hysterectomy, bilateral salpingo-oophorectomy    Intraoperative Findings: Normal uterus, left fallopian tube and ovary. Surgically absent right adnexa, Normal upper abdominal survey, Frozen: adenomyosis, grade 1 adenocarcinoma, with no evidence of myometrial invasion  Pathology: Accession #: [id number]  Diagnosis: A: Uterus, cervix, left tube and ovary, hysterectomy, LSO  Histologic type: Endometrioid adenocarcinoma   Histologic grade: FIGO grade 1 (architecture grade 1, nuclear grade 2)   Tumor site: Anterior endometrium   Tumor size: Not evident grossly; microscopically 0.7 cm in greatest dimension  Myometrial invasion: None identified, confined to adenomyosis   Serosal involvement: Not involved  Lower uterine segment involvement: Anterior lower uterine segment involved   Cervical involvement: Not involved   Adnexal involvement: Not involved  Other involved sites: Not applicable   Cervical/vaginal margin and distance: Widely negative, >3.2 cm   Lymphovascular space invasion: None identified   Regional lymph nodes: No lymph nodes submitted   Additional pathologic findings: Cervix: chronic cervicitis with endocervical glandular dilatation, tunnel clusters, and reactive epithelial changes Endometrium: complex atypical hyperplasia; proliferative endometrium  Myometrium: adenomyosis partially involved by complex atypical hyperplasia and adenocarcinoma; multiple leiomyomas measuring up to 0.5 cm in greatest dimension  Left ovary: endometriosis with focal complex hyperplasia with focal atypia, no adenocarcinoma present; epithelial inclusion cysts; corpus luteum  Left fallopian tube: unremarkable  fallopian tube with paratubal cysts and Walthard nests   Stage/Disposition: Tracie Hahn  is a 53 y.o. 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 Disposition to close follow up. Tumor should undergo MSI/IHC testing for Lynch syndrome. Of note, mother had BRCA testing that was negative, thus BRCA testing not indicated for this patient.  She comes in today for her postoperative check. She's been back at work for 3 weeks and is traveling. She's feeling quite well. She is having some hot flashes and night sweats about 6 a day. She's able to fall back asleep from them and does not want to do any hormones. She denies any bleeding any significant abdominal pain. Bowel and bladder function is normal.  Current Meds:  Outpatient Encounter Prescriptions as of 07/14/2014  Medication Sig  . acetaminophen (TYLENOL) 500 MG tablet Take 500 mg by mouth every 6 (six) hours as needed for moderate pain or headache.  . cycloSPORINE (RESTASIS) 0.05 % ophthalmic emulsion Place 1 drop into both eyes daily.  Marland Kitchen docusate sodium (COLACE) 100 MG capsule Take 100 mg by mouth.  Marland Kitchen ibuprofen (ADVIL,MOTRIN) 200 MG tablet Take 600 mg by mouth every 6 (six) hours as needed for headache or moderate pain.  Marland Kitchen ibuprofen (ADVIL,MOTRIN) 600 MG tablet   . [DISCONTINUED] norethindrone (AYGESTIN) 5 MG tablet   . [DISCONTINUED] norethindrone (AYGESTIN) 5 MG tablet   . [DISCONTINUED] oxyCODONE-acetaminophen (PERCOCET) 7.5-325 MG per tablet Take 1 tablet by mouth every 4 (four) hours as needed for pain.  . [DISCONTINUED] progesterone (PROMETRIUM) 200 MG capsule     Allergy:  Allergies  Allergen Reactions  . Shellfish Allergy     Itching, swelling, n/v    Social Hx:   History   Social History  . Marital Status: Single    Spouse Name: N/A    Number of Children: N/A  . Years of Education: N/A   Occupational History  . Not on file.   Social History Main Topics  . Smoking status:  Never Smoker   . Smokeless tobacco: Not on file  . Alcohol Use: Yes     Comment: 3-4  . Drug Use: No  . Sexual Activity: Not on file   Other Topics Concern  . Not on file   Social History Narrative    Past Surgical Hx:  Past Surgical History  Procedure Laterality Date  . Wisdom tooth extraction    . Eye surgery      lasik -bilateral  . Breast surgery      right breast biopsy  . Colonoscopy    . Dilation and curettage of uterus  2010  . Diagnostic laparoscopy  2011    right ovary removed  . Left elbow surgery  08/2010  . Dilitation & currettage/hystroscopy with versapoint resection N/A 05/07/2014    Procedure: DILATATION & CURETTAGE/HYSTEROSCOPY WITH VERSAPOINT RESECTION;  Surgeon: Princess Bruins, MD;  Location: [facility];  Service: Gynecology;  Laterality: N/A;    Past Medical Hx: History reviewed. No pertinent past medical history.  Past Gynecological History:  LMP 05/17/14, G0. S/p laparoscopic RSO for benign ovarian cyst.  No LMP recorded. Patient is postmenopausal.  Family Hx:  Family History  Problem Relation Age of Onset  . Cancer Maternal Aunt 40    BRCA positive breast and ovarian  . Cancer Maternal Uncle 55    BRCA 1 & 2 positive female breast and prostate cancer  . Cancer Maternal Grandmother 12    ovarian cancer    Vitals:  Blood pressure 119/76, pulse 69, temperature 98.5 F (36.9 C),  temperature source Oral, resp. rate 18, height $RemoveBe'5\' 9"'VySwWSPlj$  (1.753 m), weight 178 lb (80.74 kg).  Physical Exam:  Well-nourished well-developed female in no acute distress.  Abdomen: Well-healed surgical incisions. Abdomen is soft, nontender, nondistended. There are no appreciable hernias. Next  Extremities: No edema.  Pelvic: Normal female external genitalia. The vaginal cuff is visualized. The suture line is intact. Bimanual examination reveals some normal postoperative tenderness but no palpable masses or fluctuance.  Zamirah Denny A., MD   07/14/2014, 3:49 PM

## 2014-12-01 ENCOUNTER — Ambulatory Visit: Payer: BC Managed Care – PPO | Admitting: Internal Medicine

## 2014-12-06 ENCOUNTER — Other Ambulatory Visit: Payer: Self-pay

## 2014-12-06 DIAGNOSIS — Z1231 Encounter for screening mammogram for malignant neoplasm of breast: Secondary | ICD-10-CM

## 2014-12-16 ENCOUNTER — Ambulatory Visit: Payer: 59 | Attending: Gynecologic Oncology | Admitting: Gynecologic Oncology

## 2014-12-16 ENCOUNTER — Encounter: Payer: Self-pay | Admitting: Gynecologic Oncology

## 2014-12-16 VITALS — BP 112/66 | HR 61 | Temp 98.6°F | Resp 16 | Ht 69.0 in | Wt 179.5 lb

## 2014-12-16 DIAGNOSIS — Z79899 Other long term (current) drug therapy: Secondary | ICD-10-CM | POA: Insufficient documentation

## 2014-12-16 DIAGNOSIS — Z803 Family history of malignant neoplasm of breast: Secondary | ICD-10-CM | POA: Diagnosis not present

## 2014-12-16 DIAGNOSIS — C541 Malignant neoplasm of endometrium: Secondary | ICD-10-CM | POA: Insufficient documentation

## 2014-12-16 DIAGNOSIS — Z8041 Family history of malignant neoplasm of ovary: Secondary | ICD-10-CM | POA: Diagnosis not present

## 2014-12-16 NOTE — Patient Instructions (Signed)
Follow up with Dr. Dellis Filbert in 6 months and Gyn Oncology in one year.

## 2014-12-16 NOTE — Progress Notes (Signed)
Consult Note: Gyn-Onc  Referring MD: Dr. Dellis Filbert   CC: Dr Dellis Filbert Chief Complaint  Patient presents with  . endometrial cancer    Assessment/Plan:  Tracie Hahn  is a 54 y.o.  year old woman who was seen in consultation at the request of Dr Dellis Filbert for CAH/grade 1 endometrioid endometrial cancer.  Stage/Disposition: Tracie Hahn is a 54 y.o. 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 Disposition to close follow up. Tumor should undergo MSI/IHC testing for Lynch syndrome. Of note, mother had BRCA testing that was negative, thus BRCA testing not indicated for this patient. Also discussed at tumor board her MSI testing was not performed. This is been reordered. She does not will call her with the results. She will follow-up with Dr. Dellis Filbert in 6 months. She will need a Pap smear at that time her SGO guidelines. She'll return to see me in one year.   HPI:  Tracie Hahn is a 54 year old, very healthy G0 who reported 4 years of oligomenorrhagia and perimenopausal symptoms (hot flashes). In August 2015 she underwent ultrasound which revealed a thickened (1.5cm) endometrial stripe. She underwent D&C on 05/07/14. Pathology revealed complex atypical hyperplasia with foci suspicious for well differentiated endometrioid adenocarcinoma.    She has a very strong maternal history for breast and ovarian cancer which includes: Grandmother with ovarian cancer age 58 Uncle with female breast cancer age 73 (BRCA 1 and 2 mutation positive) Aunt with BRCA positive breast and ovarian cancer (ages 72 and 62). Her mother was tested for BRCA deleterious mutations and was negative. She is alive and cancer free.  There is no family history for colon cancer.  Oncology Summary:    Endometrial cancer (RAF-HCC)    06/11/2014  Initial Diagnosis  Endometrial cancer (RAF-HCC)    06/11/2014  Surgery  Robotic-assisted total laparoscopic hysterectomy, bilateral  salpingo-oophorectomy    Intraoperative Findings: Normal uterus, left fallopian tube and ovary. Surgically absent right adnexa, Normal upper abdominal survey, Frozen: adenomyosis, grade 1 adenocarcinoma, with no evidence of myometrial invasion  Pathology: Accession #: [id number]  Diagnosis: A: Uterus, cervix, left tube and ovary, hysterectomy, LSO  Histologic type: Endometrioid adenocarcinoma   Histologic grade: FIGO grade 1 (architecture grade 1, nuclear grade 2)   Tumor site: Anterior endometrium   Tumor size: Not evident grossly; microscopically 0.7 cm in greatest dimension  Myometrial invasion: None identified, confined to adenomyosis   Serosal involvement: Not involved  Lower uterine segment involvement: Anterior lower uterine segment involved   Cervical involvement: Not involved   Adnexal involvement: Not involved  Other involved sites: Not applicable   Cervical/vaginal margin and distance: Widely negative, >3.2 cm   Lymphovascular space invasion: None identified   Regional lymph nodes: No lymph nodes submitted   Additional pathologic findings: Cervix: chronic cervicitis with endocervical glandular dilatation, tunnel clusters, and reactive epithelial changes Endometrium: complex atypical hyperplasia; proliferative endometrium  Myometrium: adenomyosis partially involved by complex atypical hyperplasia and adenocarcinoma; multiple leiomyomas measuring up to 0.5 cm in greatest dimension  Left ovary: endometriosis with focal complex hyperplasia with focal atypia, no adenocarcinoma present; epithelial inclusion cysts; corpus luteum  Left fallopian tube: unremarkable fallopian tube with paratubal cysts and Walthard nests    I saw her for her postoperative check November 4. She comes in today for her first cancer surveillance visit is of course somewhat apprehensive. Her hot flashes have diminished significantly. 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 There much more infrequent. She has a mammogram due this week. There are no new medical problems and her family. She's overall enjoying a very good quality of life and continues to stay very busy. She offers no complaints.  Review of Systems  Constitutional:  Denies fever. Cardiovascular: No chest pain, shortness of breath, or edema  Pulmonary: No cough Gastro Intestinal: No nausea, vomiting, constipation, or diarrhea reported. No bright red blood per rectum or change in bowel movement.  Genitourinary: Denies vaginal bleeding and discharge.   Current Meds:  Outpatient Encounter Prescriptions as of 12/16/2014  Medication Sig  . acetaminophen (TYLENOL) 500 MG tablet Take 500 mg by mouth every 6 (six) hours as needed for moderate pain or headache.  . cycloSPORINE (RESTASIS) 0.05 % ophthalmic emulsion Place 1 drop into both eyes daily.  . diphenhydrAMINE (SOMINEX) 25 MG tablet Take 25 mg by mouth at bedtime as needed for sleep (takes 1/2 tablet prn at night for sleep).  Marland Kitchen ibuprofen (ADVIL,MOTRIN) 200 MG tablet Take 600 mg by mouth every 6 (six) hours as needed for headache or moderate pain.  Marland Kitchen docusate sodium (COLACE) 100 MG capsule Take 100 mg by mouth.  . permethrin (ELIMITE) 5 % cream   . predniSONE (DELTASONE) 20 MG tablet   . [DISCONTINUED] ibuprofen (ADVIL,MOTRIN) 600 MG tablet     Allergy:  Allergies  Allergen Reactions  . Shellfish Allergy     Itching, swelling, n/v    Social Hx:   History   Social History  . Marital Status: Single    Spouse Name: N/A  . Number of Children: N/A  . Years of Education: N/A   Occupational History  . Not on file.   Social History Main Topics  . Smoking status: Never Smoker   . Smokeless tobacco: Not on file  . Alcohol Use: Yes     Comment: 3-4  . Drug Use: No  . Sexual Activity: Not on file   Other Topics Concern  . Not on file   Social History Narrative    Past Surgical Hx:  Past Surgical  History  Procedure Laterality Date  . Wisdom tooth extraction    . Eye surgery      lasik -bilateral  . Breast surgery      right breast biopsy  . Colonoscopy    . Dilation and curettage of uterus  2010  . Diagnostic laparoscopy  2011    right ovary removed  . Left elbow surgery  08/2010  . Dilitation & currettage/hystroscopy with versapoint resection N/A 05/07/2014    Procedure: DILATATION & CURETTAGE/HYSTEROSCOPY WITH VERSAPOINT RESECTION;  Surgeon: Princess Bruins, MD;  Location: [facility];  Service: Gynecology;  Laterality: N/A;    Past Medical Hx: History reviewed. No pertinent past medical history.  Past Gynecological History:  LMP 05/17/14, G0. S/p laparoscopic RSO for benign ovarian cyst.  No LMP recorded. Patient is postmenopausal.  Family Hx:  Family History  Problem Relation Age of Onset  . Cancer Maternal Aunt 40    BRCA positive breast and ovarian  . Cancer Maternal Uncle 48    BRCA 1 & 2 positive female breast and prostate cancer  . Cancer Maternal Grandmother 70    ovarian cancer    Vitals:  Blood pressure 112/66, pulse 61, temperature 98.6 F (37 C), temperature source Oral, resp. rate 16, height 5' 9" (1.753 m), weight 179 lb 8 oz (81.421 kg).  Physical Exam:  Well-nourished well-developed female in no  acute distress.  Neck: No lymphadenopathy, no thyromegaly.  Lungs: Clear to auscultation bilateral.  Cardiac: Regular rate and rhythm.  Abdomen: Abdomen is soft, nontender, nondistended. There are no appreciable hernias. No palpable masses or hepatosplenomegaly.  Extremities: No edema.  Pelvic: Normal female external genitalia. The vaginal cuff is visualized. There are no visible lesions.  Bimanual examination reveals no masses or nodularity. Rectal confirms.  GEHRIG,PAOLA A., MD   12/16/2014, 11:49 AM

## 2014-12-30 ENCOUNTER — Ambulatory Visit
Admission: RE | Admit: 2014-12-30 | Discharge: 2014-12-30 | Disposition: A | Discharge status: Home / Self Care | Payer: 59 | Source: Ambulatory Visit

## 2014-12-30 DIAGNOSIS — Z1231 Encounter for screening mammogram for malignant neoplasm of breast: Secondary | ICD-10-CM

## 2015-08-26 ENCOUNTER — Other Ambulatory Visit: Payer: Self-pay | Admitting: Family Medicine

## 2015-08-26 ENCOUNTER — Ambulatory Visit
Admission: RE | Admit: 2015-08-26 | Discharge: 2015-08-26 | Disposition: A | Discharge status: Home / Self Care | Payer: 59 | Source: Ambulatory Visit | Attending: Family Medicine | Admitting: Family Medicine

## 2015-08-26 DIAGNOSIS — R05 Cough: Secondary | ICD-10-CM

## 2015-08-26 DIAGNOSIS — R059 Cough, unspecified: Secondary | ICD-10-CM

## 2015-11-07 IMAGING — MG MM SCREENING BREAST TOMO BILATERAL
8 series · 9 of 24 positions shown · non-contrast
Comparison: Previous exam(s).

CLINICAL DATA: Screening.

EXAM:
DIGITAL SCREENING BILATERAL MAMMOGRAM WITH 3D TOMO WITH CAD

[L MLO]
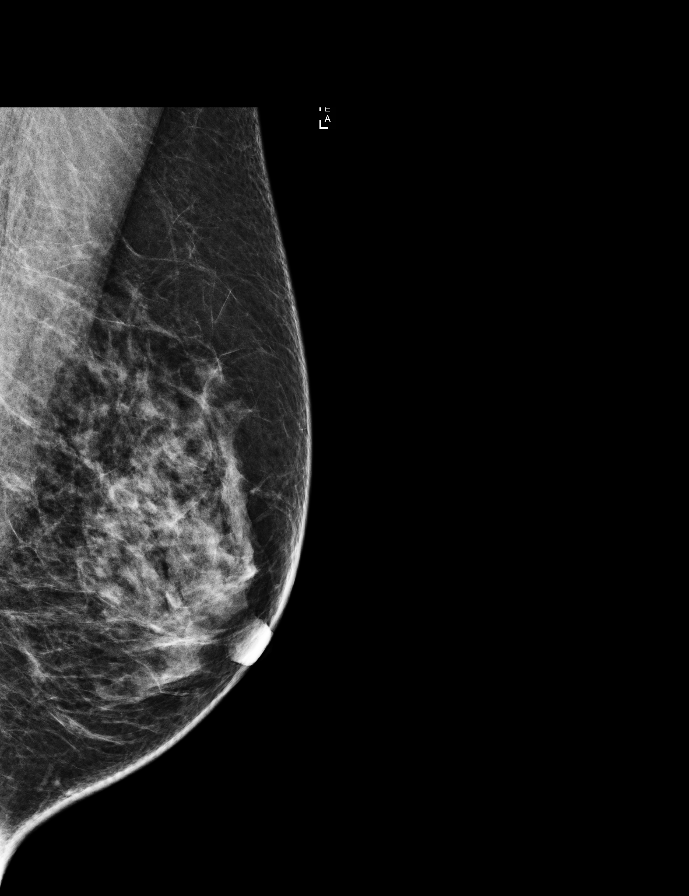

[R MLO]
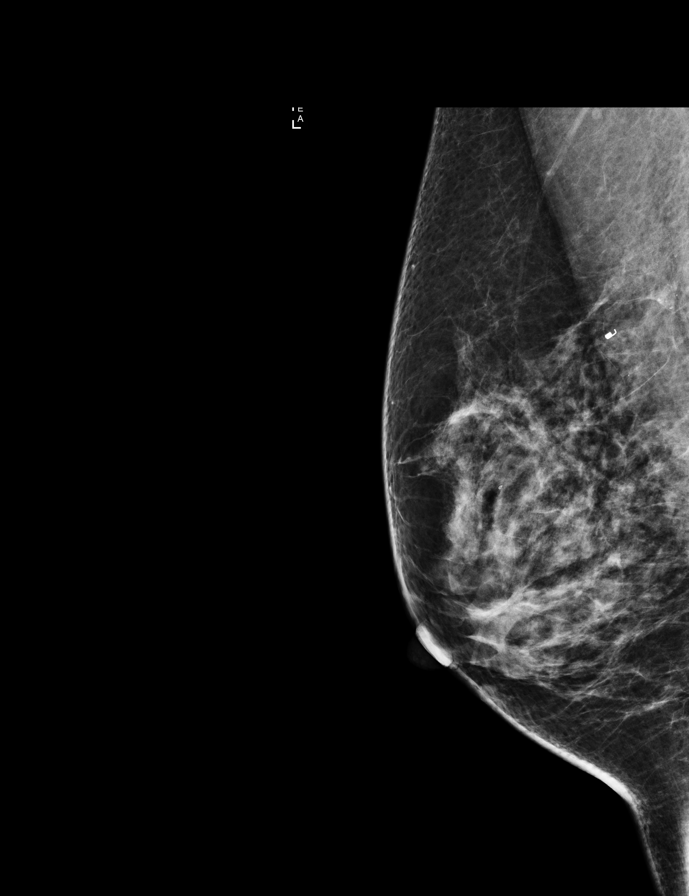

[R CC]
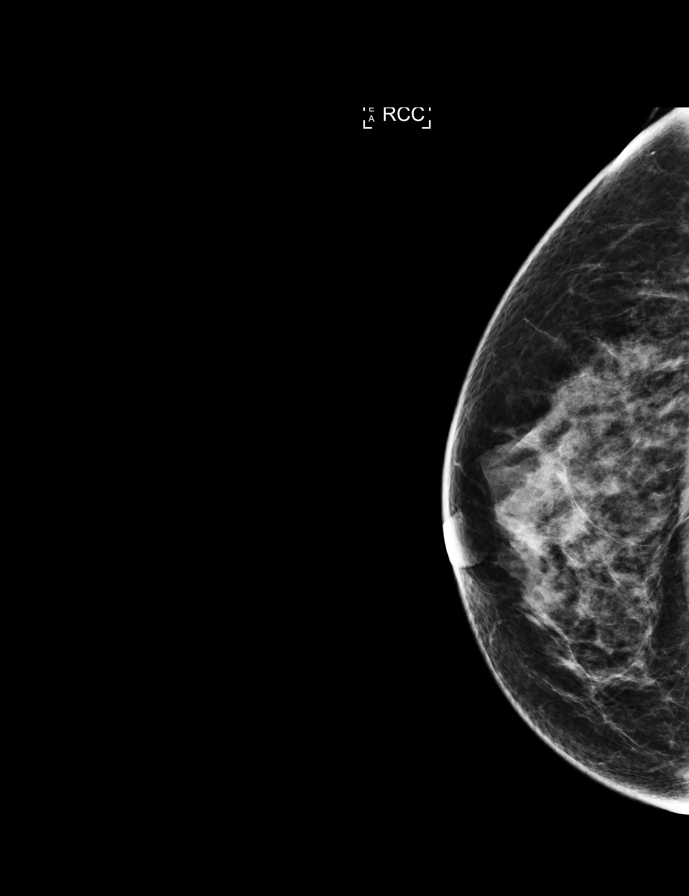

[L CC]
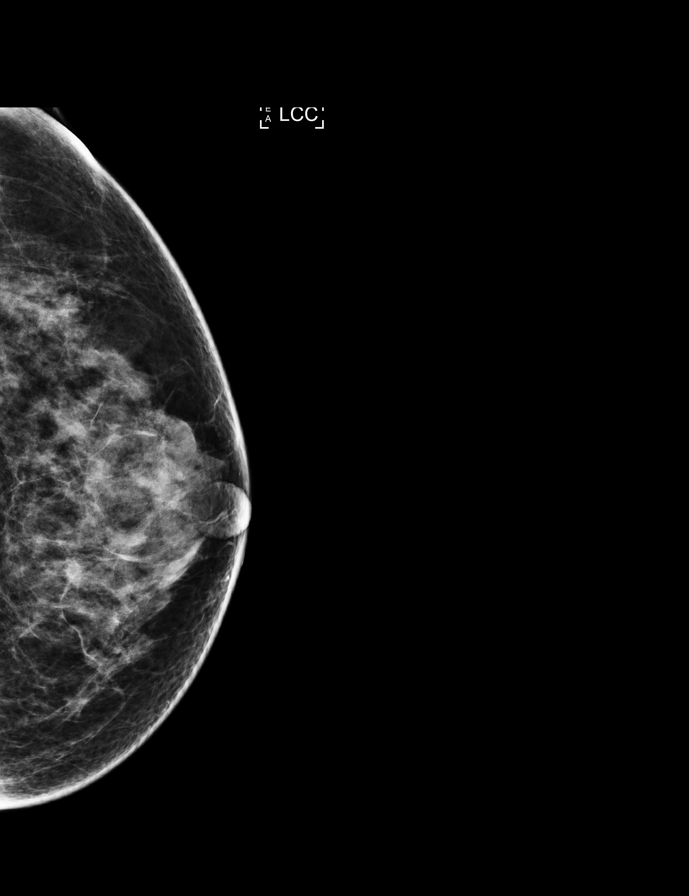

[R MLO tomo · 2 of 64 frames shown]
[frame 21/64]
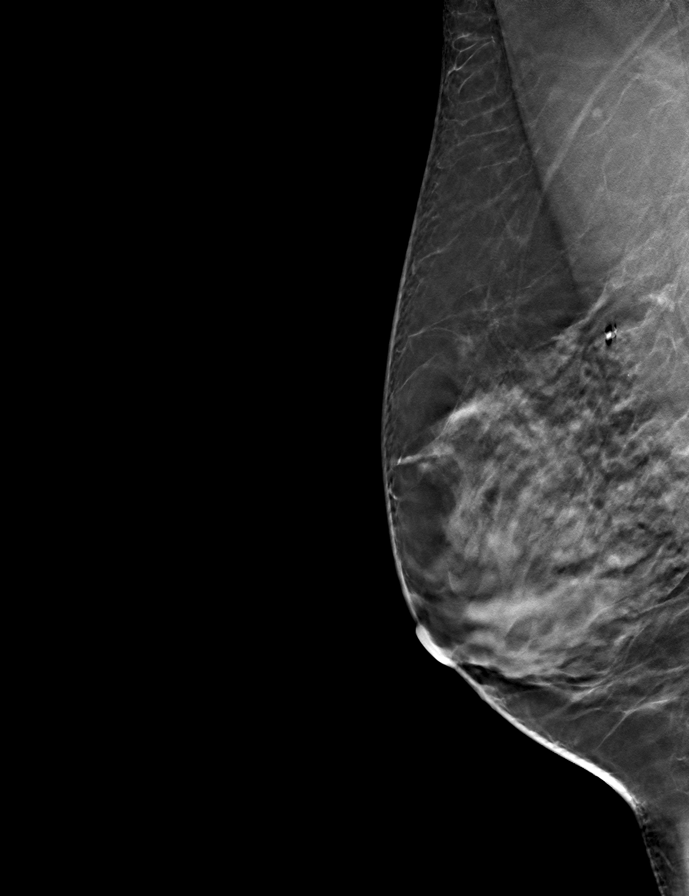
[frame 33/64]
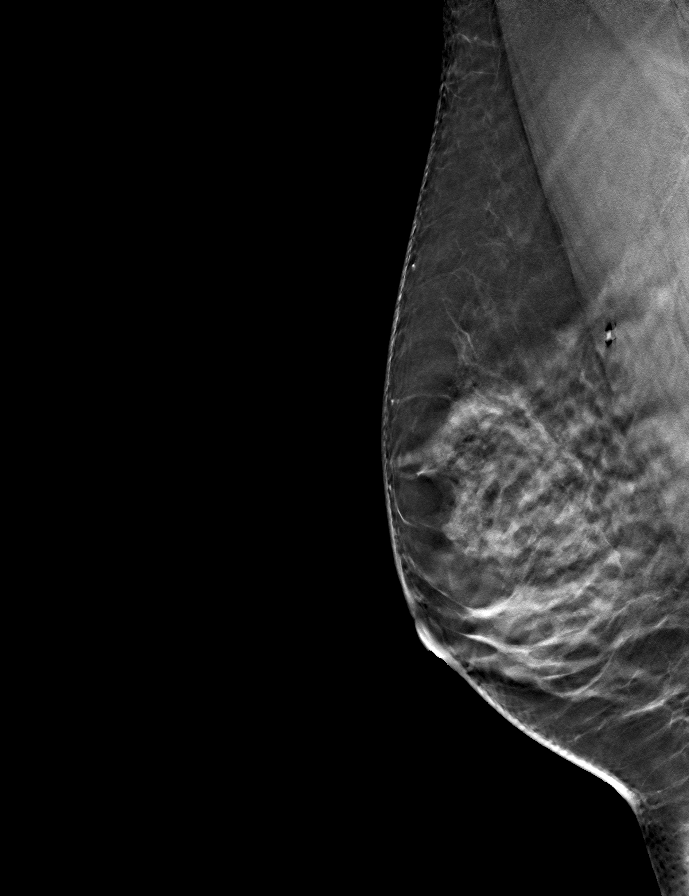

[R CC tomo · tomo slice 35/70.0]
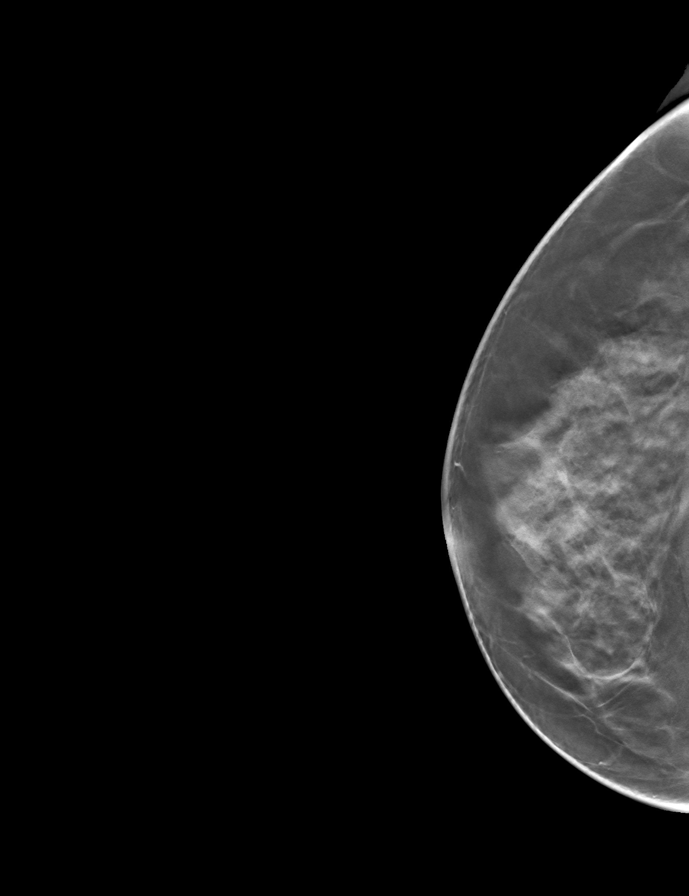

[L MLO tomo · tomo slice 33/65.0]
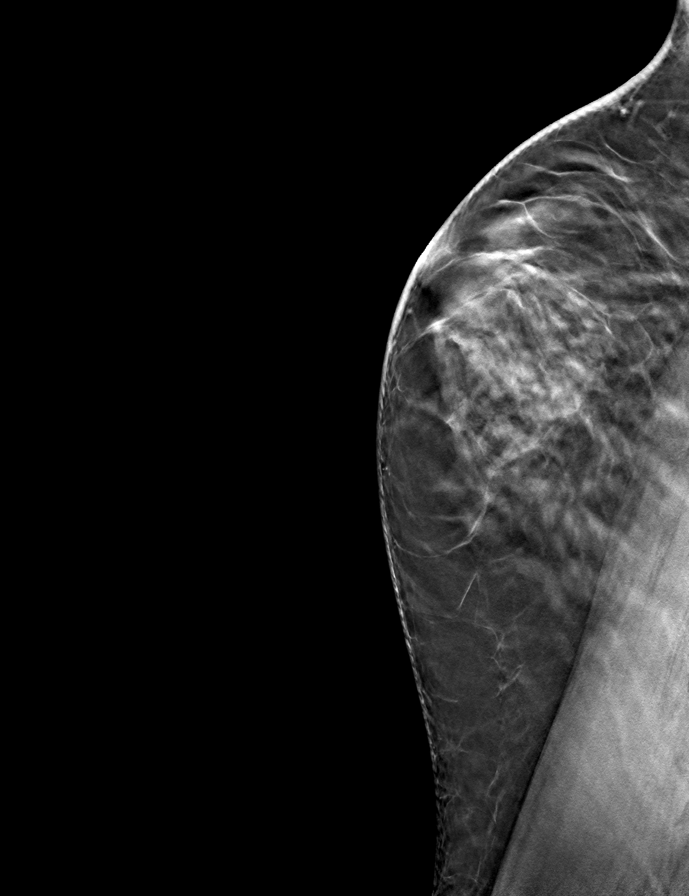

[L CC tomo · tomo slice 35/69.0]
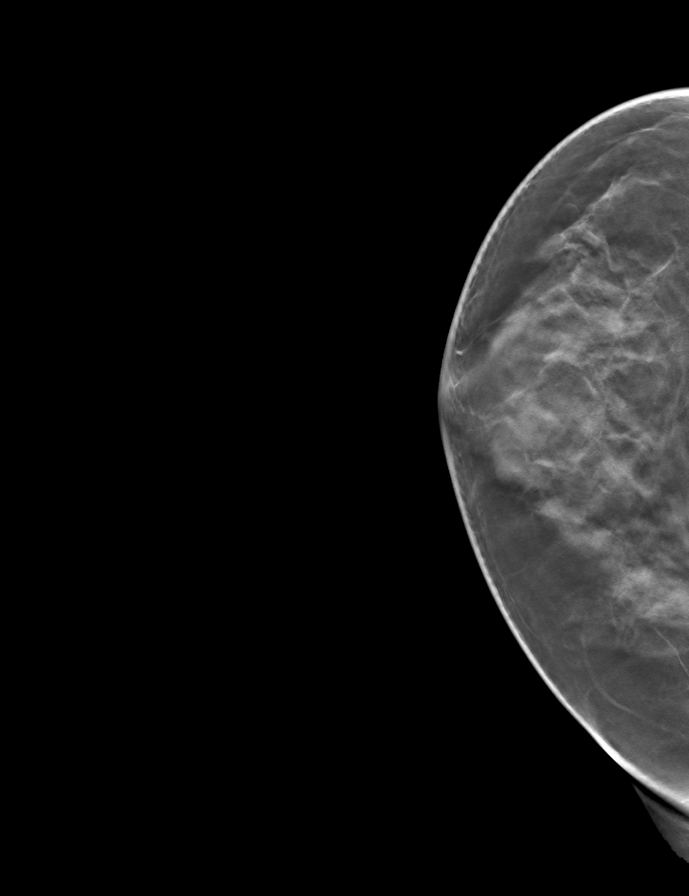

[9 of 24 positions shown; findings below may reference images not displayed]

ACR Breast Density Category c: The breast tissue is heterogeneously
dense, which may obscure small masses.
FINDINGS: There are no findings suspicious for malignancy. Images were
processed with CAD.
IMPRESSION: No mammographic evidence of malignancy. A result letter of this
screening mammogram will be mailed directly to the patient.

RECOMMENDATION:
Screening mammogram in one year. (Code:OA-G-1SS)

BI-RADS CATEGORY  1: Negative.

## 2016-01-25 ENCOUNTER — Ambulatory Visit: Payer: 59 | Admitting: Gynecologic Oncology

## 2016-03-07 ENCOUNTER — Encounter: Payer: Self-pay | Admitting: Gynecologic Oncology

## 2016-03-07 ENCOUNTER — Ambulatory Visit: Payer: 59 | Attending: Gynecologic Oncology | Admitting: Gynecologic Oncology

## 2016-03-07 VITALS — BP 126/86 | HR 78 | Temp 98.7°F | Resp 18 | Wt 186.6 lb

## 2016-03-07 DIAGNOSIS — N800 Endometriosis of uterus: Secondary | ICD-10-CM | POA: Diagnosis not present

## 2016-03-07 DIAGNOSIS — Z91013 Allergy to seafood: Secondary | ICD-10-CM | POA: Diagnosis not present

## 2016-03-07 DIAGNOSIS — C541 Malignant neoplasm of endometrium: Secondary | ICD-10-CM | POA: Insufficient documentation

## 2016-03-07 DIAGNOSIS — N72 Inflammatory disease of cervix uteri: Secondary | ICD-10-CM | POA: Insufficient documentation

## 2016-03-07 DIAGNOSIS — N801 Endometriosis of ovary: Secondary | ICD-10-CM | POA: Insufficient documentation

## 2016-03-07 DIAGNOSIS — N951 Menopausal and female climacteric states: Secondary | ICD-10-CM | POA: Diagnosis not present

## 2016-03-07 DIAGNOSIS — Z803 Family history of malignant neoplasm of breast: Secondary | ICD-10-CM | POA: Insufficient documentation

## 2016-03-07 DIAGNOSIS — Z8041 Family history of malignant neoplasm of ovary: Secondary | ICD-10-CM | POA: Insufficient documentation

## 2016-03-07 DIAGNOSIS — D259 Leiomyoma of uterus, unspecified: Secondary | ICD-10-CM | POA: Insufficient documentation

## 2016-03-07 DIAGNOSIS — N83209 Unspecified ovarian cyst, unspecified side: Secondary | ICD-10-CM | POA: Diagnosis not present

## 2016-03-07 NOTE — Progress Notes (Signed)
Consult Note: Gyn-Onc  Referring MD: Dr. Dellis Filbert   CC: Dr Dellis Filbert Chief Complaint  Patient presents with  . Follow-up    Assessment/Plan:  [name]  is a 55 y.o.  year old woman who was seen in consultation at the request of Dr Dellis Filbert for CAH/grade 1 endometrioid endometrial cancer.  Stage/Disposition: Tracie Hahn is a 55 y.o. 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 Disposition to close follow up. Tumor should undergo MSI/IHC testing for Lynch syndrome. Of note, mother had BRCA testing that was negative, thus BRCA testing not indicated for this patient. MSI was negative.  She will follow-up with Dr. Dellis Filbert in 6 months. She will need a Pap smear at that time her SGO guidelines. She'll return to see me in one year. She will follow-up with Dr. Lorie Apley office regarding when her next colonoscopy is scheduled. She will keep an eye on her vasomotor symptoms as well as his pelvic pressure. She knows to notify us if there's any changes.   HPI:  Tracie Hahn is a 55 year old, very healthy G0 who reported 4 years of oligomenorrhagia and perimenopausal symptoms (hot flashes). In August 2015 she underwent ultrasound which revealed a thickened (1.5cm) endometrial stripe. She underwent D&C on 05/07/14. Pathology revealed complex atypical hyperplasia with foci suspicious for well differentiated endometrioid adenocarcinoma.    She has a very strong maternal history for breast and ovarian cancer which includes: Grandmother with ovarian cancer age 55 Uncle with female breast cancer age 14 (BRCA 1 and 2 mutation positive) Aunt with BRCA positive breast and ovarian cancer (ages 70 and 67). Her mother was tested for BRCA deleterious mutations and was negative. She is alive and cancer free.  There is no family history for colon cancer.  Oncology Summary:    Endometrial cancer (RAF-HCC)    06/11/2014  Initial Diagnosis  Endometrial cancer  (RAF-HCC)    06/11/2014  Surgery  Robotic-assisted total laparoscopic hysterectomy, bilateral salpingo-oophorectomy    Intraoperative Findings: Normal uterus, left fallopian tube and ovary. Surgically absent right adnexa, Normal upper abdominal survey, Frozen: adenomyosis, grade 1 adenocarcinoma, with no evidence of myometrial invasion  Pathology: Accession #: [id number]  Diagnosis: A: Uterus, cervix, left tube and ovary, hysterectomy, LSO  Histologic type: Endometrioid adenocarcinoma   Histologic grade: FIGO grade 1 (architecture grade 1, nuclear grade 2)   Tumor site: Anterior endometrium   Tumor size: Not evident grossly; microscopically 0.7 cm in greatest dimension  Myometrial invasion: None identified, confined to adenomyosis   Serosal involvement: Not involved  Lower uterine segment involvement: Anterior lower uterine segment involved   Cervical involvement: Not involved   Adnexal involvement: Not involved  Other involved sites: Not applicable   Cervical/vaginal margin and distance: Widely negative, >3.2 cm   Lymphovascular space invasion: None identified   Regional lymph nodes: No lymph nodes submitted   Additional pathologic findings: Cervix: chronic cervicitis with endocervical glandular dilatation, tunnel clusters, and reactive epithelial changes Endometrium: complex atypical hyperplasia; proliferative endometrium  Myometrium: adenomyosis partially involved by complex atypical hyperplasia and adenocarcinoma; multiple leiomyomas measuring up to 0.5 cm in greatest dimension  Left ovary: endometriosis with focal complex hyperplasia with focal atypia, no adenocarcinoma present; epithelial inclusion cysts; corpus luteum  Left fallopian tube: unremarkable fallopian tube with paratubal cysts and Walthard nests   She comes in today for her cancer surveillance visit. I last saw her in April 2016. She saw Dr. Dellis Filbert 6 months ago and per the  patient had a negative Pap smear.  She'll longer has a hot flashes as much when she is trying her hair, drinking coffee, or red wine. However at night when she gets up to void she states that she has trouble falling asleep Tums times as she has a lot of things running through her mind and then she gets back in bed and feels sweaty and hot. Does not feel like a typical hot flash for her. She is ultimately able to fall back asleep. About every 2-3 weeks she'll feel some pelvic pressure when she is sitting and does not appear to be related to needing to have a bowel movement. His fairly intermittent and not particularly bothersome. She is up-to-date on her mammograms. She had a colonoscopy with Dr. Karleen Dolphin the age of 44. She's not exactly sure what her follow-up S2, she believes its 10 years. She will call Dr. Lorie Apley office to confirm.  Review of Systems  Constitutional:  Denies fever. Cardiovascular: No chest pain, shortness of breath, or edema  Pulmonary: No cough Gastro Intestinal: No nausea, vomiting, constipation, or diarrhea reported. No bright red blood per rectum or change in bowel movement.  Genitourinary: Denies vaginal bleeding and discharge.   Current Meds:  Outpatient Encounter Prescriptions as of 03/07/2016  Medication Sig  . acetaminophen (TYLENOL) 500 MG tablet Take 500 mg by mouth every 6 (six) hours as needed for moderate pain or headache.  . cycloSPORINE (RESTASIS) 0.05 % ophthalmic emulsion Place 1 drop into both eyes daily.  Marland Kitchen docusate sodium (COLACE) 100 MG capsule Take 100 mg by mouth.  Marland Kitchen ibuprofen (ADVIL,MOTRIN) 200 MG tablet Take 600 mg by mouth every 6 (six) hours as needed for headache or moderate pain.  Marland Kitchen permethrin (ELIMITE) 5 % cream   . diphenhydrAMINE (SOMINEX) 25 MG tablet Take 25 mg by mouth at bedtime as needed for sleep (takes 1/2 tablet prn at night for sleep). Reported on 03/07/2016  . [DISCONTINUED] predniSONE (DELTASONE) 20 MG tablet    No facility-administered encounter medications on file as of  03/07/2016.    Allergy:  Allergies  Allergen Reactions  . Shellfish Allergy     Itching, swelling, n/v    Social Hx:   Social History   Social History  . Marital Status: Single    Spouse Name: N/A  . Number of Children: N/A  . Years of Education: N/A   Occupational History  . Not on file.   Social History Main Topics  . Smoking status: Never Smoker   . Smokeless tobacco: Not on file  . Alcohol Use: Yes     Comment: 3-4  . Drug Use: No  . Sexual Activity: Not on file   Other Topics Concern  . Not on file   Social History Narrative    Past Surgical Hx:  Past Surgical History  Procedure Laterality Date  . Wisdom tooth extraction    . Eye surgery      lasik -bilateral  . Breast surgery      right breast biopsy  . Colonoscopy    . Dilation and curettage of uterus  2010  . Diagnostic laparoscopy  2011    right ovary removed  . Left elbow surgery  08/2010  . Dilitation & currettage/hystroscopy with versapoint resection N/A 05/07/2014    Procedure: DILATATION & CURETTAGE/HYSTEROSCOPY WITH VERSAPOINT RESECTION;  Surgeon: Princess Bruins, MD;  Location: [facility];  Service: Gynecology;  Laterality: N/A;    Past Medical Hx: History reviewed. No pertinent past medical  history.  Past Gynecological History:  LMP 05/17/14, G0. S/p laparoscopic RSO for benign ovarian cyst.  No LMP recorded. Patient is postmenopausal.  Family Hx:  Family History  Problem Relation Age of Onset  . Cancer Maternal Aunt 40    BRCA positive breast and ovarian  . Cancer Maternal Uncle 60    BRCA 1 & 2 positive female breast and prostate cancer  . Cancer Maternal Grandmother 78    ovarian cancer    Vitals:  Blood pressure 126/86, pulse 78, temperature 98.7 F (37.1 C), temperature source Oral, resp. rate 18, weight 186 lb 9.6 oz (84.641 kg), SpO2 98 %.  Physical Exam:  Well-nourished well-developed female in no acute distress.  Neck: No lymphadenopathy, no thyromegaly.  Lungs: Clear to  auscultation bilateral.  Cardiac: Regular rate and rhythm.  Abdomen: Abdomen is soft, nontender, nondistended. There are no appreciable hernias and there are well-healed surgical incisions. No palpable masses or hepatosplenomegaly.  Extremities: No edema.  Groins: No lymphadenopathy.  Pelvic: Normal female external genitalia. The vaginal cuff is visualized. There are no visible lesions.  Bimanual examination reveals no masses or nodularity. Rectal confirms.  Island Dohmen A., MD   03/07/2016, 9:36 AM

## 2016-03-07 NOTE — Patient Instructions (Signed)
Follow-up with Dr. Dellis Filbert in 6 months and return to see [place] in one year. Have a great summer!

## 2016-03-14 ENCOUNTER — Other Ambulatory Visit: Payer: Self-pay | Admitting: Obstetrics & Gynecology

## 2016-03-14 DIAGNOSIS — Z1231 Encounter for screening mammogram for malignant neoplasm of breast: Secondary | ICD-10-CM

## 2016-04-03 ENCOUNTER — Ambulatory Visit
Admission: RE | Admit: 2016-04-03 | Discharge: 2016-04-03 | Disposition: A | Discharge status: Home / Self Care | Payer: 59 | Source: Ambulatory Visit | Attending: Obstetrics & Gynecology | Admitting: Obstetrics & Gynecology

## 2016-04-03 DIAGNOSIS — Z1231 Encounter for screening mammogram for malignant neoplasm of breast: Secondary | ICD-10-CM

## 2016-09-25 DIAGNOSIS — H40019 Open angle with borderline findings, low risk, unspecified eye: Secondary | ICD-10-CM | POA: Diagnosis not present

## 2016-09-28 ENCOUNTER — Ambulatory Visit: Payer: Self-pay | Admitting: General Surgery

## 2016-09-28 DIAGNOSIS — K439 Ventral hernia without obstruction or gangrene: Secondary | ICD-10-CM | POA: Diagnosis not present

## 2016-10-19 ENCOUNTER — Encounter (HOSPITAL_COMMUNITY): Payer: Self-pay

## 2016-10-22 ENCOUNTER — Encounter (HOSPITAL_COMMUNITY): Payer: Self-pay

## 2016-10-22 ENCOUNTER — Encounter (HOSPITAL_COMMUNITY)
Admission: RE | Admit: 2016-10-22 | Discharge: 2016-10-22 | Disposition: A | Discharge status: Home / Self Care | Payer: 59 | Source: Ambulatory Visit | Attending: General Surgery | Admitting: General Surgery

## 2016-10-22 DIAGNOSIS — Z01812 Encounter for preprocedural laboratory examination: Secondary | ICD-10-CM | POA: Diagnosis not present

## 2016-10-22 HISTORY — DX: Malignant (primary) neoplasm, unspecified: C80.1

## 2016-10-22 HISTORY — DX: Ventral hernia without obstruction or gangrene: K43.9

## 2016-10-22 LAB — BASIC METABOLIC PANEL
Anion gap: 9 (ref 5–15)
BUN: 16 mg/dL (ref 6–20)
CHLORIDE: 103 mmol/L (ref 101–111)
CO2: 26 mmol/L (ref 22–32)
Calcium: 9.5 mg/dL (ref 8.9–10.3)
Creatinine, Ser: 0.94 mg/dL (ref 0.44–1.00)
GFR calc Af Amer: >60 mL/min (ref >60)
GFR calc non Af Amer: >60 mL/min (ref >60)
GLUCOSE: 118 mg/dL — AB (ref 65–99)
POTASSIUM: 4.2 mmol/L (ref 3.5–5.1)
Sodium: 138 mmol/L (ref 135–145)

## 2016-10-22 LAB — CBC
HEMATOCRIT: 39.9 % (ref 36.0–46.0)
HEMOGLOBIN: 13.4 g/dL (ref 12.0–15.0)
MCH: 29.6 pg (ref 26.0–34.0)
MCHC: 33.6 g/dL (ref 30.0–36.0)
MCV: 88.1 fL (ref 78.0–100.0)
Platelets: 209 10*3/uL (ref 150–400)
RBC: 4.53 MIL/uL (ref 3.87–5.11)
RDW: 12.2 % (ref 11.5–15.5)
WBC: 4.1 10*3/uL (ref 4.0–10.5)

## 2016-10-22 NOTE — Pre-Procedure Instructions (Addendum)
Tracie Hahn  10/22/2016      [facility], [place] [address] Phone: [phone] Fax: [phone]    Your procedure is scheduled on Feb. 16  Report to [facility] at [phone].  Call this number if you have problems the morning of surgery:  [phone]   Remember:  Do not eat food or drink liquids after midnight.  Take these medicines the morning of surgery with A SIP OF WATER  none         Drink breeze drink(given to you at preop) At 330 am day of surgery  Stop taking Aspirin, BC's, Goody's, Herbal medications, Fish Oil, Ibuprofen, Advil, Motrin, Aleve, Vitamins/supplements    Do not wear jewelry, make-up or nail polish.  Do not wear lotions, powders, or perfumes, or deoderant.  Do not shave 48 hours prior to surgery.  Men may shave face and neck.  Do not bring valuables to the hospital.  [facility] is not responsible for any belongings or valuables.  Contacts, dentures or bridgework may not be worn into surgery.  Leave your suitcase in the car.  After surgery it may be brought to your room.  For patients admitted to the hospital, discharge time will be determined by your treatment team.  Patients discharged the day of surgery will not be allowed to drive home.    Special instructions:  [facility] - Preparing for Surgery  Before surgery, you can play an important role.  Because skin is not sterile, your skin needs to be as free of germs as possible.  You can reduce the number of germs on you skin by washing with CHG (chlorahexidine gluconate) soap before surgery.  CHG is an antiseptic cleaner which kills germs and bonds with the skin to continue killing germs even after washing.  Please DO NOT use if you have an allergy to CHG or antibacterial soaps.  If your skin becomes reddened/irritated stop using the CHG and inform your nurse when you arrive at [facility].  Do not shave (including legs and underarms) for at least 48 hours prior to the first CHG shower.  You may shave your face.  Please follow these instructions carefully:   1.  Shower with CHG Soap the night before surgery and the   morning of Surgery.  2.  If you choose to wash your hair, wash your hair first as usual with your  normal shampoo.  3.  After you shampoo, rinse your hair and body thoroughly to remove the Shampoo.  4.  Use CHG as you would any other liquid soap.  You can apply chg directly to the skin and wash gently with scrungie or a clean washcloth.  5.  Apply the CHG Soap to your body ONLY FROM THE NECK DOWN.  Do not use on open wounds or open sores.  Avoid contact with your eyes,       ears, mouth and genitals (private parts).  Wash genitals (private parts) with your normal soap.  6.  Wash thoroughly, paying special attention to the area where your surgery  will be performed.  7.  Thoroughly rinse your body with warm water from the neck down.  8.  DO NOT shower/wash with your normal soap after using and rinsing off   the CHG Soap.  9.  Pat yourself dry with a clean towel.  10.  Wear clean pajamas.            11.  Place clean sheets on your bed the night of your first shower and do not sleep with pets.  Day of Surgery  Do not apply any lotions/deoderants the morning of surgery.  Please wear clean clothes to the hospital/surgery center.  '   Please read over the  fact sheets that you were given.

## 2016-10-25 NOTE — Anesthesia Preprocedure Evaluation (Addendum)
Anesthesia Evaluation  Patient identified by MRN, date of birth, ID band Patient awake    Reviewed: Allergy & Precautions, H&P , NPO status , Patient's Chart, lab work & pertinent test results, reviewed documented beta blocker date and time   History of Anesthesia Complications (+) history of anesthetic complications (h/o sensitivity to medications, h/o motion sickness)  Airway Mallampati: II  TM Distance: >3 FB Neck ROM: Full    Dental no notable dental hx. (+) Dental Advisory Given   Pulmonary neg pulmonary ROS,    Pulmonary exam normal        Cardiovascular Exercise Tolerance: Good negative cardio ROS Normal cardiovascular exam     Neuro/Psych negative neurological ROS  negative psych ROS   GI/Hepatic negative GI ROS, Neg liver ROS,   Endo/Other  negative endocrine ROS  Renal/GU negative Renal ROS     Musculoskeletal   Abdominal   Peds  Hematology negative hematology ROS (+)   Anesthesia Other Findings   Reproductive/Obstetrics negative OB ROS                            Anesthesia Physical  Anesthesia Plan  ASA: II  Anesthesia Plan: General   Post-op Pain Management:    Induction: Intravenous  Airway Management Planned: Oral ETT  Additional Equipment:   Intra-op Plan:   Post-operative Plan: Extubation in OR  Informed Consent: I have reviewed the patients History and Physical, chart, labs and discussed the procedure including the risks, benefits and alternatives for the proposed anesthesia with the patient or authorized representative who has indicated his/her understanding and acceptance.   Dental advisory given  Plan Discussed with: CRNA, Anesthesiologist and Surgeon  Anesthesia Plan Comments:        Anesthesia Quick Evaluation

## 2016-10-26 ENCOUNTER — Ambulatory Visit (HOSPITAL_COMMUNITY): Payer: 59 | Admitting: Anesthesiology

## 2016-10-26 ENCOUNTER — Encounter (HOSPITAL_COMMUNITY)
Admission: RE | Disposition: A | Discharge status: Home / Self Care | Payer: Self-pay | Source: Ambulatory Visit | Attending: General Surgery

## 2016-10-26 ENCOUNTER — Encounter (HOSPITAL_COMMUNITY): Payer: Self-pay | Admitting: *Deleted

## 2016-10-26 ENCOUNTER — Ambulatory Visit (HOSPITAL_COMMUNITY)
Admission: RE | Admit: 2016-10-26 | Discharge: 2016-10-26 | Disposition: A | Discharge status: Home / Self Care | Payer: 59 | Source: Ambulatory Visit | Attending: General Surgery | Admitting: General Surgery

## 2016-10-26 DIAGNOSIS — K439 Ventral hernia without obstruction or gangrene: Secondary | ICD-10-CM | POA: Diagnosis not present

## 2016-10-26 DIAGNOSIS — Z8589 Personal history of malignant neoplasm of other organs and systems: Secondary | ICD-10-CM | POA: Diagnosis not present

## 2016-10-26 DIAGNOSIS — Z91013 Allergy to seafood: Secondary | ICD-10-CM | POA: Insufficient documentation

## 2016-10-26 HISTORY — PX: INSERTION OF MESH: SHX5868

## 2016-10-26 HISTORY — PX: VENTRAL HERNIA REPAIR: SHX424

## 2016-10-26 SURGERY — REPAIR, HERNIA, VENTRAL, LAPAROSCOPIC
Anesthesia: General | Site: Abdomen

## 2016-10-26 MED ORDER — DEXTROSE 5 % IV SOLN
INTRAVENOUS | Status: DC | PRN
  Administered 2016-10-26: 08:00:00 via INTRAVENOUS

## 2016-10-26 MED ORDER — VECURONIUM BROMIDE 10 MG IV SOLR
INTRAVENOUS | Status: AC
  Filled 2016-10-26: qty 10

## 2016-10-26 MED ORDER — FENTANYL CITRATE (PF) 100 MCG/2ML IJ SOLN
INTRAMUSCULAR | Status: DC | PRN
  Administered 2016-10-26: 100 ug via INTRAVENOUS
  Administered 2016-10-26 (×2): 50 ug via INTRAVENOUS

## 2016-10-26 MED ORDER — HYDROMORPHONE HCL 1 MG/ML IJ SOLN
INTRAMUSCULAR | Status: AC
  Filled 2016-10-26: qty 0.5

## 2016-10-26 MED ORDER — ARTIFICIAL TEARS OP OINT
TOPICAL_OINTMENT | OPHTHALMIC | Status: AC
  Filled 2016-10-26: qty 7

## 2016-10-26 MED ORDER — PROPOFOL 10 MG/ML IV BOLUS
INTRAVENOUS | Status: AC
  Filled 2016-10-26: qty 40

## 2016-10-26 MED ORDER — CHLORHEXIDINE GLUCONATE CLOTH 2 % EX PADS: 6.0000 | MEDICATED_PAD | Freq: Once | CUTANEOUS | Status: DC

## 2016-10-26 MED ORDER — ROCURONIUM BROMIDE 50 MG/5ML IV SOSY
PREFILLED_SYRINGE | INTRAVENOUS | Status: AC
  Filled 2016-10-26: qty 5

## 2016-10-26 MED ORDER — SCOPOLAMINE 1 MG/3DAYS TD PT72
1.0000 | MEDICATED_PATCH | TRANSDERMAL | Status: DC
  Administered 2016-10-26: 1.5 mg via TRANSDERMAL
  Filled 2016-10-26: qty 1

## 2016-10-26 MED ORDER — DEXAMETHASONE SODIUM PHOSPHATE 10 MG/ML IJ SOLN
INTRAMUSCULAR | Status: DC | PRN
  Administered 2016-10-26: 10 mg via INTRAVENOUS

## 2016-10-26 MED ORDER — LACTATED RINGERS IV SOLN
INTRAVENOUS | Status: DC | PRN
  Administered 2016-10-26 (×2): via INTRAVENOUS

## 2016-10-26 MED ORDER — BUPIVACAINE HCL (PF) 0.25 % IJ SOLN
INTRAMUSCULAR | Status: AC
  Filled 2016-10-26: qty 30

## 2016-10-26 MED ORDER — BUPIVACAINE LIPOSOME 1.3 % IJ SUSP
20.0000 mL | Freq: Once | INTRAMUSCULAR | Status: AC
  Administered 2016-10-26: 20 mL
  Filled 2016-10-26: qty 20

## 2016-10-26 MED ORDER — ROCURONIUM BROMIDE 100 MG/10ML IV SOLN
INTRAVENOUS | Status: DC | PRN
  Administered 2016-10-26: 50 mg via INTRAVENOUS

## 2016-10-26 MED ORDER — ONDANSETRON HCL 4 MG/2ML IJ SOLN
INTRAMUSCULAR | Status: AC
  Filled 2016-10-26: qty 2

## 2016-10-26 MED ORDER — MIDAZOLAM HCL 5 MG/5ML IJ SOLN
INTRAMUSCULAR | Status: DC | PRN
  Administered 2016-10-26: 2 mg via INTRAVENOUS

## 2016-10-26 MED ORDER — CELECOXIB 200 MG PO CAPS
400.0000 mg | ORAL_CAPSULE | ORAL | Status: AC
  Administered 2016-10-26: 400 mg via ORAL
  Filled 2016-10-26: qty 2

## 2016-10-26 MED ORDER — PHENYLEPHRINE 40 MCG/ML (10ML) SYRINGE FOR IV PUSH (FOR BLOOD PRESSURE SUPPORT)
PREFILLED_SYRINGE | INTRAVENOUS | Status: AC
  Filled 2016-10-26: qty 10

## 2016-10-26 MED ORDER — SUGAMMADEX SODIUM 200 MG/2ML IV SOLN
INTRAVENOUS | Status: DC | PRN
  Administered 2016-10-26: 200 mg via INTRAVENOUS

## 2016-10-26 MED ORDER — CEFAZOLIN SODIUM-DEXTROSE 2-4 GM/100ML-% IV SOLN
2.0000 g | INTRAVENOUS | Status: AC
  Administered 2016-10-26: 2 g via INTRAVENOUS
  Filled 2016-10-26: qty 100

## 2016-10-26 MED ORDER — DEXAMETHASONE SODIUM PHOSPHATE 10 MG/ML IJ SOLN
INTRAMUSCULAR | Status: AC
  Filled 2016-10-26: qty 1

## 2016-10-26 MED ORDER — PROPOFOL 10 MG/ML IV BOLUS
INTRAVENOUS | Status: DC | PRN
  Administered 2016-10-26: 200 mg via INTRAVENOUS
  Administered 2016-10-26: 30 mg via INTRAVENOUS

## 2016-10-26 MED ORDER — LIDOCAINE 2% (20 MG/ML) 5 ML SYRINGE
INTRAMUSCULAR | Status: AC
  Filled 2016-10-26: qty 5

## 2016-10-26 MED ORDER — ACETAMINOPHEN 500 MG PO TABS
1000.0000 mg | ORAL_TABLET | ORAL | Status: AC
  Administered 2016-10-26: 1000 mg via ORAL
  Filled 2016-10-26: qty 2

## 2016-10-26 MED ORDER — SUCCINYLCHOLINE CHLORIDE 200 MG/10ML IV SOSY
PREFILLED_SYRINGE | INTRAVENOUS | Status: AC
  Filled 2016-10-26: qty 10

## 2016-10-26 MED ORDER — BUPIVACAINE-EPINEPHRINE (PF) 0.5% -1:200000 IJ SOLN
INTRAMUSCULAR | Status: AC
  Filled 2016-10-26: qty 30

## 2016-10-26 MED ORDER — SUGAMMADEX SODIUM 200 MG/2ML IV SOLN
INTRAVENOUS | Status: AC
  Filled 2016-10-26: qty 2

## 2016-10-26 MED ORDER — BUPIVACAINE HCL (PF) 0.25 % IJ SOLN
INTRAMUSCULAR | Status: DC | PRN
  Administered 2016-10-26: 10 mL

## 2016-10-26 MED ORDER — ONDANSETRON HCL 4 MG/2ML IJ SOLN
INTRAMUSCULAR | Status: DC | PRN
Start: 2016-10-26 — End: 2016-10-26
  Administered 2016-10-26: 4 mg via INTRAVENOUS

## 2016-10-26 MED ORDER — FENTANYL CITRATE (PF) 100 MCG/2ML IJ SOLN
INTRAMUSCULAR | Status: AC
  Filled 2016-10-26: qty 4

## 2016-10-26 MED ORDER — HYDROMORPHONE HCL 1 MG/ML IJ SOLN
0.2500 mg | INTRAMUSCULAR | Status: DC | PRN
  Administered 2016-10-26 (×2): 0.5 mg via INTRAVENOUS

## 2016-10-26 MED ORDER — HYDROCODONE-ACETAMINOPHEN 5-325 MG PO TABS: 1.0000 | ORAL_TABLET | ORAL | 0 refills | Status: DC | PRN

## 2016-10-26 MED ORDER — LIDOCAINE HCL (CARDIAC) 20 MG/ML IV SOLN
INTRAVENOUS | Status: DC | PRN
  Administered 2016-10-26: 100 mg via INTRAVENOUS

## 2016-10-26 MED ORDER — GABAPENTIN 300 MG PO CAPS
300.0000 mg | ORAL_CAPSULE | ORAL | Status: AC
  Administered 2016-10-26: 300 mg via ORAL
  Filled 2016-10-26: qty 1

## 2016-10-26 MED ORDER — ARTIFICIAL TEARS OP OINT
TOPICAL_OINTMENT | OPHTHALMIC | Status: DC | PRN
  Administered 2016-10-26: 1 via OPHTHALMIC

## 2016-10-26 MED ORDER — 0.9 % SODIUM CHLORIDE (POUR BTL) OPTIME
TOPICAL | Status: DC | PRN
  Administered 2016-10-26: 1000 mL

## 2016-10-26 MED ORDER — EPHEDRINE 5 MG/ML INJ
INTRAVENOUS | Status: AC
  Filled 2016-10-26: qty 10

## 2016-10-26 MED ORDER — PROMETHAZINE HCL 25 MG/ML IJ SOLN: 6.2500 mg | INTRAMUSCULAR | Status: DC | PRN

## 2016-10-26 MED ORDER — MIDAZOLAM HCL 2 MG/2ML IJ SOLN
INTRAMUSCULAR | Status: AC
  Filled 2016-10-26: qty 2

## 2016-10-26 SURGICAL SUPPLY — 58 items
ADH SKN CLS APL DERMABOND .7 (GAUZE/BANDAGES/DRESSINGS) ×1
APPLIER CLIP LOGIC TI 5 (MISCELLANEOUS) IMPLANT
APPLIER CLIP ROT 10 11.4 M/L (STAPLE)
APR CLP MED LRG 11.4X10 (STAPLE)
APR CLP MED LRG 33X5 (MISCELLANEOUS)
BINDER ABD UNIV 10 28-50 (GAUZE/BANDAGES/DRESSINGS) IMPLANT
BINDER ABDOM UNIV 10 (GAUZE/BANDAGES/DRESSINGS) ×2
BINDER ABDOMINAL 12 ML 46-62 (SOFTGOODS) IMPLANT
BNDG GAUZE ELAST 4 BULKY (GAUZE/BANDAGES/DRESSINGS) ×1 IMPLANT
CANISTER SUCTION 2500CC (MISCELLANEOUS) IMPLANT
CHLORAPREP W/TINT 26ML (MISCELLANEOUS) ×2 IMPLANT
CLIP APPLIE ROT 10 11.4 M/L (STAPLE) IMPLANT
COVER SURGICAL LIGHT HANDLE (MISCELLANEOUS) ×2 IMPLANT
DERMABOND ADVANCED (GAUZE/BANDAGES/DRESSINGS) ×1
DERMABOND ADVANCED .7 DNX12 (GAUZE/BANDAGES/DRESSINGS) ×1 IMPLANT
DEVICE SECURE STRAP 25 ABSORB (INSTRUMENTS) ×3 IMPLANT
DEVICE TROCAR PUNCTURE CLOSURE (ENDOMECHANICALS) ×2 IMPLANT
DRAPE INCISE IOBAN 66X45 STRL (DRAPES) ×2 IMPLANT
ELECT CAUTERY BLADE 6.4 (BLADE) ×2 IMPLANT
ELECT REM PT RETURN 9FT ADLT (ELECTROSURGICAL) ×2
ELECTRODE REM PT RTRN 9FT ADLT (ELECTROSURGICAL) ×1 IMPLANT
GLOVE BIO SURGEON STRL SZ7.5 (GLOVE) ×2 IMPLANT
GLOVE BIOGEL PI IND STRL 6.5 (GLOVE) IMPLANT
GLOVE BIOGEL PI IND STRL 7.0 (GLOVE) IMPLANT
GLOVE BIOGEL PI INDICATOR 6.5 (GLOVE) ×2
GLOVE BIOGEL PI INDICATOR 7.0 (GLOVE) ×1
GLOVE ECLIPSE 6.5 STRL STRAW (GLOVE) ×2 IMPLANT
GLOVE SURG SS PI 6.0 STRL IVOR (GLOVE) ×2 IMPLANT
GLOVE SURG SS PI 6.5 STRL IVOR (GLOVE) ×1 IMPLANT
GOWN STRL REUS W/ TWL LRG LVL3 (GOWN DISPOSABLE) ×3 IMPLANT
GOWN STRL REUS W/TWL LRG LVL3 (GOWN DISPOSABLE) ×10
KIT BASIN OR (CUSTOM PROCEDURE TRAY) ×2 IMPLANT
KIT ROOM TURNOVER OR (KITS) ×2 IMPLANT
MARKER SKIN DUAL TIP RULER LAB (MISCELLANEOUS) ×2 IMPLANT
MESH VENTRALIGHT ST 4X6IN (Mesh General) ×1 IMPLANT
NDL HYPO 25GX1X1/2 BEV (NEEDLE) IMPLANT
NDL SPNL 22GX3.5 QUINCKE BK (NEEDLE) ×1 IMPLANT
NEEDLE HYPO 25GX1X1/2 BEV (NEEDLE) ×2 IMPLANT
NEEDLE SPNL 22GX3.5 QUINCKE BK (NEEDLE) ×2 IMPLANT
NS IRRIG 1000ML POUR BTL (IV SOLUTION) ×2 IMPLANT
PAD ARMBOARD 7.5X6 YLW CONV (MISCELLANEOUS) ×4 IMPLANT
PENCIL BUTTON HOLSTER BLD 10FT (ELECTRODE) ×2 IMPLANT
SCALPEL HARMONIC ACE (MISCELLANEOUS) ×1 IMPLANT
SCISSORS LAP 5X35 DISP (ENDOMECHANICALS) ×1 IMPLANT
SET IRRIG TUBING LAPAROSCOPIC (IRRIGATION / IRRIGATOR) IMPLANT
SLEEVE ENDOPATH XCEL 5M (ENDOMECHANICALS) ×2 IMPLANT
SUT MNCRL AB 4-0 PS2 18 (SUTURE) ×3 IMPLANT
SUT NOVA NAB DX-16 0-1 5-0 T12 (SUTURE) ×3 IMPLANT
SUT VIC AB 3-0 SH 27 (SUTURE) ×2
SUT VIC AB 3-0 SH 27XBRD (SUTURE) ×1 IMPLANT
SYR CONTROL 10ML LL (SYRINGE) ×1 IMPLANT
TOWEL OR 17X24 6PK STRL BLUE (TOWEL DISPOSABLE) ×2 IMPLANT
TRAY FOLEY CATH 16FR SILVER (SET/KITS/TRAYS/PACK) ×2 IMPLANT
TRAY LAPAROSCOPIC MC (CUSTOM PROCEDURE TRAY) ×2 IMPLANT
TROCAR XCEL BLUNT TIP 100MML (ENDOMECHANICALS) IMPLANT
TROCAR XCEL NON-BLD 11X100MML (ENDOMECHANICALS) IMPLANT
TROCAR XCEL NON-BLD 5MMX100MML (ENDOMECHANICALS) ×2 IMPLANT
TUBING INSUFFLATION (TUBING) ×2 IMPLANT

## 2016-10-26 NOTE — Anesthesia Procedure Notes (Addendum)
Procedure Name: Intubation Date/Time: 10/26/2016 7:41 AM Performed by: Jacquiline Doe A Pre-anesthesia Checklist: Patient identified, Emergency Drugs available, Suction available and Patient being monitored Patient Re-evaluated:Patient Re-evaluated prior to inductionOxygen Delivery Method: Circle System Utilized and Circle system utilized Preoxygenation: Pre-oxygenation with 100% oxygen Intubation Type: IV induction and Cricoid Pressure applied Ventilation: Mask ventilation without difficulty Laryngoscope Size: Mac and 4 Grade View: Grade I Tube type: Oral Tube size: 7.5 mm Number of attempts: 1 Airway Equipment and Method: Stylet Placement Confirmation: ETT inserted through vocal cords under direct vision,  positive ETCO2 and breath sounds checked- equal and bilateral Secured at: 22 cm Tube secured with: Tape Dental Injury: Teeth and Oropharynx as per pre-operative assessment

## 2016-10-26 NOTE — Op Note (Signed)
10/26/2016  9:20 AM  PATIENT:  Tracie Hahn  56 y.o. female  PRE-OPERATIVE DIAGNOSIS:  ventral hernia  POST-OPERATIVE DIAGNOSIS:  ventral hernia  PROCEDURE:  Procedure(s): LAPAROSCOPIC VENTRAL HERNIA REPAIR WITH INSERTION OF MESH (N/A)  SURGEON:  Surgeon(s) and Role:    * Jovita Kussmaul, MD - Primary  PHYSICIAN ASSISTANT:   ASSISTANTS: Jamie Sipsis, RNFA   ANESTHESIA:   local and general  EBL:  Total I/O In: 1100 [I.V.:1100] Out: 210 [Urine:200; Blood:10]  BLOOD ADMINISTERED:none  DRAINS: none   LOCAL MEDICATIONS USED:  MARCAINE     SPECIMEN:  Source of Specimen:  none  DISPOSITION OF SPECIMEN:  N/A  COUNTS:  YES  TOURNIQUET:  * No tourniquets in log *  DICTATION: .Dragon Dictation   After informed consent was obtained the patient was brought to the operating room and placed in the supine position on the operating room table. After adequate induction of general anesthesia the patient's abdomen was prepped with ChloraPrep, allowed to dry, and draped in usual sterile manner including the use of an Ioban drape. An appropriate timeout was performed. A site was chosen in the left upper quadrant to access the abdominal cavity. This area was infiltrated with quarter percent Marcaine. A small stab incision was made with a 15 blade knife. A 5 mm Optiview port and camera were used to bluntly dissect through the layers of the abdominal wall under direct vision until access was gained to the abdominal cavity. The abdomen was then insufflated with carbon dioxide without difficulty. The abdomen was inspected and there were no adhesions to the abdominal wall. The hernia appeared to be covered by the lower part of the falciform ligament. Another 5 mm port was placed in the left lower quadrant under direct vision. A Harmonic Scalpel was used to take down the falciform ligament and clear the abdominal wall so that the edge of the hernia could be visualized. There was a small hernia in  the supraumbilical position as well as an even smaller hernia at the umbilicus. Next I made a small incision overlying the upper hernia with a 15 blade knife. The incision was carried through the skin and subcutaneous tissues tissue sharply with electrocautery until the hernia was entered. The hernia sac was excised sharply with the electrocautery. Next a 10 x 15 cm piece of ventral light mesh was chosen. The mesh was oriented with the coated side towards the bowel. 4 #1 Novafil stitches were placed at equidistant points around the edge of the mesh. The mesh was then placed through the fascial defect and into the abdominal cavity with the appropriate orientation. The fascial defect was closed with a #1 Novafil stitch as well as a 0 Vicryl stitch. The abdomen was then insufflated again. 4 small stab incisions were made at points that corresponded to the 4 anchoring stitches. A suture passer was used to bring the tails of each anchor stitch through the abdominal wall. Once this was accomplished each of these stitches was cinched down and tied and the mesh was observed to be in good apposition to the anterior abdominal wall. The gaps between the stitches was filled in with a secure strap tacker. Once this was accomplished the mesh was in good position without any redundancy and no gaps around the edges. The area was examined and found to be hemostatic. The abdomen was then generally inspected in all 4 quadrants and no other abnormalities were noted. The gas was allowed to escape and the mesh  was observed to remain in good apposition to the abdominal wall. The subcutaneous tissue at the upper hernia incision was closed with an interrupted 3-0 Vicryl stitch. The rest of the skin incisions were closed with interrupted 4-0 Monocryl subcuticular stitches. Each area was infiltrated with Exparel. Dermabond dressings were then applied. The patient tolerated procedure well. At the end of the case all needle sponge and  instrument counts were correct. The patient was then awakened and taken to recovery in stable condition.  PLAN OF CARE: Discharge to home after PACU  PATIENT DISPOSITION:  PACU - hemodynamically stable.   Delay start of Pharmacological VTE agent (>24hrs) due to surgical blood loss or risk of bleeding: not applicable

## 2016-10-26 NOTE — H&P (Signed)
Tracie Hahn  Location: [facility] Patient #: [id number] DOB: Oct 18, 1960 Single / Language: Cleophus Molt / Race: White Female   History of Present Illness  The patient is a 56 year old female who presents with abdominal swelling. The patient is a 56 year old white female who has a history of endometrial cancer. She underwent a robotic hysterectomy with oophorectomy about one year ago. About 6 months ago she started noticing a bulge just above her umbilicus when she was working out at [employer]. She denies any significant abdominal pain. She denies any fevers or chills.    Review of Systems  General Not Present- Appetite Loss, Chills, Fatigue, Fever, Night Sweats, Weight Gain and Weight Loss. Skin Not Present- Change in Wart/Mole, Dryness, Hives, Jaundice, New Lesions, Non-Healing Wounds, Rash and Ulcer. HEENT Not Present- Earache, Hearing Loss, Hoarseness, Nose Bleed, Oral Ulcers, Ringing in the Ears, Seasonal Allergies, Sinus Pain, Sore Throat, Visual Disturbances, Wears glasses/contact lenses and Yellow Eyes. Respiratory Not Present- Bloody sputum, Chronic Cough, Difficulty Breathing, Snoring and Wheezing. Breast Not Present- Breast Mass, Breast Pain, Nipple Discharge and Skin Changes. Cardiovascular Not Present- Chest Pain, Difficulty Breathing Lying Down, Leg Cramps, Palpitations, Rapid Heart Rate, Shortness of Breath and Swelling of Extremities. Gastrointestinal Not Present- Abdominal Pain, Bloating, Bloody Stool, Change in Bowel Habits, Chronic diarrhea, Constipation, Difficulty Swallowing, Excessive gas, Gets full quickly at meals, Hemorrhoids, Indigestion, Nausea, Rectal Pain and Vomiting. Female Genitourinary Not Present- Frequency, Nocturia, Painful Urination, Pelvic Pain and Urgency. Musculoskeletal Not Present- Back Pain, Joint Pain, Joint Stiffness, Muscle Pain, Muscle Weakness and Swelling of Extremities. Neurological Not Present- Decreased Memory, Fainting,  Headaches, Numbness, Seizures, Tingling, Tremor, Trouble walking and Weakness. Psychiatric Not Present- Anxiety, Bipolar, Change in Sleep Pattern, Depression, Fearful and Frequent crying. Endocrine Not Present- Cold Intolerance, Excessive Hunger, Hair Changes, Heat Intolerance, Hot flashes and New Diabetes. Hematology Not Present- Easy Bruising, Excessive bleeding, Gland problems, HIV and Persistent Infections.   Physical Exam General Mental Status-Alert. General Appearance-Consistent with stated age. Hydration-Well hydrated. Voice-Normal.  Head and Neck Head-normocephalic, atraumatic with no lesions or palpable masses. Trachea-midline. Thyroid Gland Characteristics - normal size and consistency.  Eye Eyeball - Bilateral-Extraocular movements intact. Sclera/Conjunctiva - Bilateral-No scleral icterus.  Chest and Lung Exam Chest and lung exam reveals -quiet, even and easy respiratory effort with no use of accessory muscles and on auscultation, normal breath sounds, no adventitious sounds and normal vocal resonance. Inspection Chest Wall - Normal. Back - normal.  Cardiovascular Cardiovascular examination reveals -normal heart sounds, regular rate and rhythm with no murmurs and normal pedal pulses bilaterally.  Abdomen Note: The abdomen is soft and nontender. There is a small reducible nontender hernia at the umbilicus. When she stands up there is also a small reducible bulge at the supraumbilical incision.   Neurologic Neurologic evaluation reveals -alert and oriented x 3 with no impairment of recent or remote memory. Mental Status-Normal.  Musculoskeletal Normal Exam - Left-Upper Extremity Strength Normal and Lower Extremity Strength Normal. Normal Exam - Right-Upper Extremity Strength Normal and Lower Extremity Strength Normal.  Lymphatic Head & Neck  General Head & Neck Lymphatics: Bilateral - Description - Normal. Axillary  General  Axillary Region: Bilateral - Description - Normal. Tenderness - Non Tender. Femoral & Inguinal  Generalized Femoral & Inguinal Lymphatics: Bilateral - Description - Normal. Tenderness - Non Tender.    Assessment & Plan Tracie Dibbles S. Marlou Starks MD; 09/28/2016 9:28 AM) VENTRAL HERNIA WITHOUT OBSTRUCTION OR GANGRENE (K43.9) Impression: The patient appears to have a small  ventral hernia that is possibly related to her previous robotic surgery. She also has a small umbilical hernia as well. She has no signs of obstruction. Because of the risk of incarceration or strangulation of things she would benefit from having the hernia fixed. She would also like to have this Hahn. I have discussed with her in detail the risks and benefits of the operation to do this as well as some of the technical aspects and she understands and wishes to proceed. I would plan for a laparoscopic ventral hernia repair with mesh.

## 2016-10-26 NOTE — Transfer of Care (Signed)
Immediate Anesthesia Transfer of Care Note  Patient: Tracie Hahn  Procedure(s) Performed: Procedure(s): LAPAROSCOPIC VENTRAL HERNIA (N/A) INSERTION OF MESH (N/A)  Patient Location: PACU  Anesthesia Type:General  Level of Consciousness: awake, oriented, sedated, patient cooperative and responds to stimulation  Airway & Oxygen Therapy: Patient Spontanous Breathing and Patient connected to nasal cannula oxygen  Post-op Assessment: Report given to RN, Post -op Vital signs reviewed and stable, Patient moving all extremities and Patient moving all extremities X 4  Post vital signs: Reviewed and stable  Last Vitals:  Vitals:   10/26/16 0551 10/26/16 0932  BP: 115/66   Pulse: (!) 58   Resp: 18   Temp: 36.7 C (P) 36.3 C    Last Pain:  Vitals:   10/26/16 0551  TempSrc: Oral      Patients Stated Pain Goal: 3 ([id number])  Complications: No apparent anesthesia complications

## 2016-10-26 NOTE — Addendum Note (Signed)
Addendum  created 10/26/16 1221 by Fidela Juneau, CRNA   Anesthesia Intra Meds edited

## 2016-10-26 NOTE — Anesthesia Postprocedure Evaluation (Addendum)
Anesthesia Post Note  Patient: Tracie Hahn  Procedure(s) Performed: Procedure(s) (LRB): LAPAROSCOPIC VENTRAL HERNIA (N/A) INSERTION OF MESH (N/A)  Patient location during evaluation: PACU Anesthesia Type: General Level of consciousness: sedated Pain management: pain level controlled Vital Signs Assessment: post-procedure vital signs reviewed and stable Respiratory status: spontaneous breathing and respiratory function stable Cardiovascular status: stable Anesthetic complications: no       Last Vitals:  Vitals:   10/26/16 1000 10/26/16 1004  BP:  119/74  Pulse: 68 71  Resp: 13 14  Temp:      Last Pain:  Vitals:   10/26/16 1000  TempSrc:   PainSc: [place]

## 2016-10-26 NOTE — Interval H&P Note (Signed)
History and Physical Interval Note:  10/26/2016 7:20 AM  Tracie Hahn  has presented today for surgery, with the diagnosis of ventral hernia  The various methods of treatment have been discussed with the patient and family. After consideration of risks, benefits and other options for treatment, the patient has consented to  Procedure(s): LAPAROSCOPIC VENTRAL HERNIA (N/A) INSERTION OF MESH (N/A) as a surgical intervention .  The patient's history has been reviewed, patient examined, no change in status, stable for surgery.  I have reviewed the patient's chart and labs.  Questions were answered to the patient's satisfaction.     TOTH III,[name]

## 2016-10-29 ENCOUNTER — Encounter (HOSPITAL_COMMUNITY): Payer: Self-pay | Admitting: General Surgery

## 2016-11-28 DIAGNOSIS — M79671 Pain in right foot: Secondary | ICD-10-CM | POA: Diagnosis not present

## 2016-12-14 DIAGNOSIS — M76812 Anterior tibial syndrome, left leg: Secondary | ICD-10-CM | POA: Diagnosis not present

## 2016-12-25 DIAGNOSIS — M79671 Pain in right foot: Secondary | ICD-10-CM | POA: Diagnosis not present

## 2016-12-28 DIAGNOSIS — M79671 Pain in right foot: Secondary | ICD-10-CM | POA: Diagnosis not present

## 2017-01-01 DIAGNOSIS — M79671 Pain in right foot: Secondary | ICD-10-CM | POA: Diagnosis not present

## 2017-01-04 DIAGNOSIS — M79671 Pain in right foot: Secondary | ICD-10-CM | POA: Diagnosis not present

## 2017-01-09 DIAGNOSIS — M79671 Pain in right foot: Secondary | ICD-10-CM | POA: Diagnosis not present

## 2017-01-11 DIAGNOSIS — M79671 Pain in right foot: Secondary | ICD-10-CM | POA: Diagnosis not present

## 2017-01-15 DIAGNOSIS — M79671 Pain in right foot: Secondary | ICD-10-CM | POA: Diagnosis not present

## 2017-01-18 DIAGNOSIS — M79671 Pain in right foot: Secondary | ICD-10-CM | POA: Diagnosis not present

## 2017-02-09 NOTE — Addendum Note (Signed)
Addendum  created 02/09/17 0942 by Teighan Aubert, MD   Sign clinical note    

## 2017-03-08 ENCOUNTER — Other Ambulatory Visit: Payer: Self-pay | Admitting: Obstetrics & Gynecology

## 2017-03-08 DIAGNOSIS — Z1231 Encounter for screening mammogram for malignant neoplasm of breast: Secondary | ICD-10-CM

## 2017-04-08 ENCOUNTER — Ambulatory Visit
Admission: RE | Admit: 2017-04-08 | Discharge: 2017-04-08 | Disposition: A | Discharge status: Home / Self Care | Payer: 59 | Source: Ambulatory Visit | Attending: Obstetrics & Gynecology | Admitting: Obstetrics & Gynecology

## 2017-04-08 DIAGNOSIS — Z1231 Encounter for screening mammogram for malignant neoplasm of breast: Secondary | ICD-10-CM | POA: Diagnosis not present

## 2017-04-12 ENCOUNTER — Encounter: Payer: Self-pay | Admitting: Obstetrics & Gynecology

## 2017-04-12 ENCOUNTER — Ambulatory Visit (INDEPENDENT_AMBULATORY_CARE_PROVIDER_SITE_OTHER): Payer: 59 | Admitting: Obstetrics & Gynecology

## 2017-04-12 VITALS — Ht 69.25 in | Wt 176.0 lb

## 2017-04-12 DIAGNOSIS — C541 Malignant neoplasm of endometrium: Secondary | ICD-10-CM | POA: Diagnosis not present

## 2017-04-12 DIAGNOSIS — Z780 Asymptomatic menopausal state: Secondary | ICD-10-CM

## 2017-04-12 DIAGNOSIS — Z01411 Encounter for gynecological examination (general) (routine) with abnormal findings: Secondary | ICD-10-CM

## 2017-04-12 NOTE — Patient Instructions (Signed)
1. Encounter for gynecological examination with abnormal finding Gyn exam s/p Robotic TLH/BSO Staging.  Pap reflex done.  Breasts wnl.  Screening mammo neg 04/08/2017.  2. Endometrial cancer ([place]) T1a Endometrial AdenoCa.  S/P Robotic TLH/BSO Staging 2015.  Pap reflex done.  Recent Umbilical hernia repair.  3. Menopause present No HRT.  Will do Dexa.  Vit D supplement.  Ca++ rich diet.  Preventive Kegels.  Tracie Hahn, it was a pleasure to see you today!  I will inform you of your results as soon as available.   Kegel Exercises Kegel exercises help strengthen the muscles that support the rectum, vagina, small intestine, bladder, and uterus. Doing Kegel exercises can help:  Improve bladder and bowel control.  Improve sexual response.  Reduce problems and discomfort during pregnancy.  Kegel exercises involve squeezing your pelvic floor muscles, which are the same muscles you squeeze when you try to stop the flow of urine. The exercises can be done while sitting, standing, or lying down, but it is best to vary your position. Phase 1 exercises 1. Squeeze your pelvic floor muscles tight. You should feel a tight lift in your rectal area. If you are a female, you should also feel a tightness in your vaginal area. Keep your stomach, buttocks, and legs relaxed. 2. Hold the muscles tight for up to 10 seconds. 3. Relax your muscles. Repeat this exercise 50 times a day or as many times as told by your health care provider. Continue to do this exercise for at least 4-6 weeks or for as long as told by your health care provider. This information is not intended to replace advice given to you by your health care provider. Make sure you discuss any questions you have with your health care provider. Document Released: 08/13/2012 Document Revised: 04/21/2016 Document Reviewed: 07/17/2015 Elsevier Interactive Patient Education  [employer].

## 2017-04-12 NOTE — Addendum Note (Signed)
Addended by: Thurnell Garbe A on: 04/12/2017 08:56 AM   Modules accepted: Orders

## 2017-04-12 NOTE — Progress Notes (Signed)
    Tracie Hahn 04/24/1961 [id number]   History:    56 y.o. G0 same sex relationship.  RP:  Established patient presenting for annual gyn exam  HPI:  Menopause.  No HRT.  S/P Robotic TLH/BSO Dr Alycia Rossetti for Endometrial Ca 2015.   Endometrial AdenoCa Stage T1a.  Very physically active/fitness.  Had a small umbilical hernia which she decided to have repaired earlier than later.  Hernia repair 10/2016 without Cx.  Breasts wnl.  Mictions normal.  No SUI, except if bladder is very full.  BMs wnl.  Past medical history,surgical history, family history and social history were all reviewed and documented in the EPIC chart.  Gynecologic History No LMP recorded. Patient is postmenopausal. Contraception: status post hysterectomy Last Pap: 2017. Results were: normal Last mammogram: 04/08/2017. Results were: Negative  Obstetric History OB History  Gravida Para Term Preterm AB Living  0 0 0 0 0 0  SAB TAB Ectopic Multiple Live Births  0 0 0 0 0         ROS: A ROS was performed and pertinent positives and negatives are included in the history.  GENERAL: No fevers or chills. HEENT: No change in vision, no earache, sore throat or sinus congestion. NECK: No pain or stiffness. CARDIOVASCULAR: No chest pain or pressure. No palpitations. PULMONARY: No shortness of breath, cough or wheeze. GASTROINTESTINAL: No abdominal pain, nausea, vomiting or diarrhea, melena or bright red blood per rectum. GENITOURINARY: No urinary frequency, urgency, hesitancy or dysuria. MUSCULOSKELETAL: No joint or muscle pain, no back pain, no recent trauma. DERMATOLOGIC: No rash, no itching, no lesions. ENDOCRINE: No polyuria, polydipsia, no heat or cold intolerance. No recent change in weight. HEMATOLOGICAL: No anemia or easy bruising or bleeding. NEUROLOGIC: No headache, seizures, numbness, tingling or weakness. PSYCHIATRIC: No depression, no loss of interest in normal activity or change in sleep pattern.     Exam:   Ht  5' 9.25" (1.759 m)   Wt 176 lb (79.8 kg)   BMI 25.80 kg/m   Body mass index is 25.8 kg/m.  General appearance : Well developed well nourished female. No acute distress HEENT: Eyes: no retinal hemorrhage or exudates,  Neck supple, trachea midline, no carotid bruits, no thyroidmegaly Lungs: Clear to auscultation, no rhonchi or wheezes, or rib retractions  Heart: Regular rate and rhythm, no murmurs or gallops Breast:Examined in sitting and supine position were symmetrical in appearance, no palpable masses or tenderness,  no skin retraction, no nipple inversion, no nipple discharge, no skin discoloration, no axillary or supraclavicular lymphadenopathy Abdomen: no palpable masses or tenderness, no rebound or guarding Extremities: no edema or skin discoloration or tenderness  Pelvic: Vulva: Normal  Bartholin, Urethra, Skene Glands: Within normal limits             Vagina: No gross lesions or discharge.  Pap reflex done.  Cervix/Uterus Absent  Adnexa  Without masses or tenderness  Anus and perineum  normal    Assessment/Plan:  56 y.o. female for annual exam   1. Encounter for gynecological examination with abnormal finding Gyn exam s/p Robotic TLH/BSO Staging.  Pap reflex done.  Breasts wnl.  Screening mammo neg 04/08/2017.  2. Endometrial cancer ([place]) T1a Endometrial AdenoCa.  S/P Robotic TLH/BSO Staging 2015.  Pap reflex done.  Recent Umbilical hernia repair.  3. Menopause present No HRT.  Will do Dexa.  Vit D supplement.  Ca++ rich diet.  Preventive Kegels.  Princess Bruins MD, 8:19 AM 04/12/2017

## 2017-04-16 LAB — PAP IG W/ RFLX HPV ASCU

## 2017-04-23 ENCOUNTER — Other Ambulatory Visit: Payer: Self-pay | Admitting: Gynecology

## 2017-04-23 DIAGNOSIS — Z13820 Encounter for screening for osteoporosis: Secondary | ICD-10-CM

## 2017-04-30 ENCOUNTER — Encounter: Payer: Self-pay | Admitting: Gynecology

## 2017-04-30 ENCOUNTER — Ambulatory Visit (INDEPENDENT_AMBULATORY_CARE_PROVIDER_SITE_OTHER): Payer: 59

## 2017-04-30 DIAGNOSIS — Z13820 Encounter for screening for osteoporosis: Secondary | ICD-10-CM

## 2017-06-18 DIAGNOSIS — Z91013 Allergy to seafood: Secondary | ICD-10-CM | POA: Diagnosis not present

## 2017-06-18 DIAGNOSIS — G5600 Carpal tunnel syndrome, unspecified upper limb: Secondary | ICD-10-CM | POA: Diagnosis not present

## 2017-06-18 DIAGNOSIS — E785 Hyperlipidemia, unspecified: Secondary | ICD-10-CM | POA: Diagnosis not present

## 2017-06-18 DIAGNOSIS — R202 Paresthesia of skin: Secondary | ICD-10-CM | POA: Diagnosis not present

## 2017-06-18 DIAGNOSIS — E559 Vitamin D deficiency, unspecified: Secondary | ICD-10-CM | POA: Diagnosis not present

## 2017-06-26 DIAGNOSIS — M1812 Unilateral primary osteoarthritis of first carpometacarpal joint, left hand: Secondary | ICD-10-CM | POA: Diagnosis not present

## 2017-06-26 DIAGNOSIS — M79645 Pain in left finger(s): Secondary | ICD-10-CM | POA: Diagnosis not present

## 2017-06-26 DIAGNOSIS — M542 Cervicalgia: Secondary | ICD-10-CM | POA: Diagnosis not present

## 2017-06-26 DIAGNOSIS — R200 Anesthesia of skin: Secondary | ICD-10-CM | POA: Diagnosis not present

## 2017-08-07 DIAGNOSIS — M542 Cervicalgia: Secondary | ICD-10-CM | POA: Diagnosis not present

## 2017-08-07 DIAGNOSIS — M1812 Unilateral primary osteoarthritis of first carpometacarpal joint, left hand: Secondary | ICD-10-CM | POA: Diagnosis not present

## 2017-08-14 DIAGNOSIS — M542 Cervicalgia: Secondary | ICD-10-CM | POA: Diagnosis not present

## 2017-08-14 DIAGNOSIS — M5412 Radiculopathy, cervical region: Secondary | ICD-10-CM | POA: Diagnosis not present

## 2017-08-17 DIAGNOSIS — M50223 Other cervical disc displacement at C6-C7 level: Secondary | ICD-10-CM | POA: Diagnosis not present

## 2017-08-17 DIAGNOSIS — M4802 Spinal stenosis, cervical region: Secondary | ICD-10-CM | POA: Diagnosis not present

## 2017-08-20 DIAGNOSIS — L570 Actinic keratosis: Secondary | ICD-10-CM | POA: Diagnosis not present

## 2017-08-20 DIAGNOSIS — L821 Other seborrheic keratosis: Secondary | ICD-10-CM | POA: Diagnosis not present

## 2017-08-28 DIAGNOSIS — M542 Cervicalgia: Secondary | ICD-10-CM | POA: Diagnosis not present

## 2017-09-04 DIAGNOSIS — M542 Cervicalgia: Secondary | ICD-10-CM | POA: Diagnosis not present

## 2017-11-04 ENCOUNTER — Other Ambulatory Visit: Payer: Self-pay | Admitting: Obstetrics & Gynecology

## 2017-11-04 DIAGNOSIS — Z1231 Encounter for screening mammogram for malignant neoplasm of breast: Secondary | ICD-10-CM

## 2018-04-09 ENCOUNTER — Ambulatory Visit
Admission: RE | Admit: 2018-04-09 | Discharge: 2018-04-09 | Disposition: A | Discharge status: Home / Self Care | Payer: 59 | Source: Ambulatory Visit | Attending: Obstetrics & Gynecology | Admitting: Obstetrics & Gynecology

## 2018-04-09 DIAGNOSIS — Z1231 Encounter for screening mammogram for malignant neoplasm of breast: Secondary | ICD-10-CM

## 2018-04-17 ENCOUNTER — Encounter: Payer: Self-pay | Admitting: Obstetrics & Gynecology

## 2018-04-17 ENCOUNTER — Ambulatory Visit (INDEPENDENT_AMBULATORY_CARE_PROVIDER_SITE_OTHER): Payer: 59 | Admitting: Obstetrics & Gynecology

## 2018-04-17 VITALS — BP 108/68 | Ht 69.0 in | Wt 178.0 lb

## 2018-04-17 DIAGNOSIS — C541 Malignant neoplasm of endometrium: Secondary | ICD-10-CM | POA: Diagnosis not present

## 2018-04-17 DIAGNOSIS — Z1272 Encounter for screening for malignant neoplasm of vagina: Secondary | ICD-10-CM

## 2018-04-17 DIAGNOSIS — Z780 Asymptomatic menopausal state: Secondary | ICD-10-CM | POA: Diagnosis not present

## 2018-04-17 DIAGNOSIS — Z8041 Family history of malignant neoplasm of ovary: Secondary | ICD-10-CM

## 2018-04-17 DIAGNOSIS — Z01419 Encounter for gynecological examination (general) (routine) without abnormal findings: Secondary | ICD-10-CM

## 2018-04-17 NOTE — Addendum Note (Signed)
Addended by: Thurnell Garbe A on: 04/17/2018 09:26 AM   Modules accepted: Orders

## 2018-04-17 NOTE — Patient Instructions (Signed)
1. Well female exam with routine gynecological exam Gynecologic exam status post TLH/BSO.  Pap reflex on the vaginal vault.  Breast exam normal.  Screening mammogram negative in July 2019.  Colonoscopy in 2014.  Will do fasting health labs here today. - CBC - Comp Met (CMET) - Lipid panel - TSH - VITAMIN D 25 Hydroxy (Vit-D Deficiency, Fractures)  2. Vaginal Pap smear   3. Endometrial ca [facility]) Status post robotic TLH/BSO with Dr. Alycia Rossetti for endometrial adenocarcinoma stage T1a in 2015.  Pap reflex done on the vaginal vault today.  4. Menopause present Well on no hormone replacement therapy.  Bone density done August 2018 was completely normal.  Recommend vitamin D supplements, calcium rich nutrition with or without supplements to reach at least 1.2 to 1.5 g/day and regular weightbearing physical activity.  5. Family history of ovarian cancer Will do Ca125 today. - CA 125  Tracie Hahn, it was a pleasure seeing you today!  I will inform you of your results as soon as they are available.

## 2018-04-17 NOTE — Progress Notes (Signed)
Tracie Hahn 1961-08-23 [id number]   History:    57 y.o. G0 Stable same sex partner x 23 yrs, getting married in September 2019.  RP:  Established patient presenting for annual gyn exam   HPI: Status post robotic TLH/BSO in 2015 for endometrial adenocarcinoma stage T1 a.  Menopause on no hormone replacement therapy.  Strong family history of breast cancer and ovarian cancer, but mother was BR CA 1 and 2 negative.  No pelvic pain.  Urine normal.  Bowel movements normal.  Very fit and healthy nutrition.  Body mass index 26.29.  Past medical history,surgical history, family history and social history were all reviewed and documented in the EPIC chart.  Gynecologic History No LMP recorded. Patient is postmenopausal. Contraception: status post hysterectomy Last Pap: 04/2017. Results were: Negative Last mammogram: 03/2018. Results were: Negative Bone Density: Normal 04/2017 Colonoscopy: 2014  Obstetric History OB History  Gravida Para Term Preterm AB Living  0 0 0 0 0 0  SAB TAB Ectopic Multiple Live Births  0 0 0 0 0     ROS: A ROS was performed and pertinent positives and negatives are included in the history.  GENERAL: No fevers or chills. HEENT: No change in vision, no earache, sore throat or sinus congestion. NECK: No pain or stiffness. CARDIOVASCULAR: No chest pain or pressure. No palpitations. PULMONARY: No shortness of breath, cough or wheeze. GASTROINTESTINAL: No abdominal pain, nausea, vomiting or diarrhea, melena or bright red blood per rectum. GENITOURINARY: No urinary frequency, urgency, hesitancy or dysuria. MUSCULOSKELETAL: No joint or muscle pain, no back pain, no recent trauma. DERMATOLOGIC: No rash, no itching, no lesions. ENDOCRINE: No polyuria, polydipsia, no heat or cold intolerance. No recent change in weight. HEMATOLOGICAL: No anemia or easy bruising or bleeding. NEUROLOGIC: No headache, seizures, numbness, tingling or weakness. PSYCHIATRIC: No depression, no  loss of interest in normal activity or change in sleep pattern.     Exam:   BP 108/68   Ht '5\' 9"'$  (1.753 m)   Wt 178 lb (80.7 kg)   BMI 26.29 kg/m   Body mass index is 26.29 kg/m.  General appearance : Well developed well nourished female. No acute distress HEENT: Eyes: no retinal hemorrhage or exudates,  Neck supple, trachea midline, no carotid bruits, no thyroidmegaly Lungs: Clear to auscultation, no rhonchi or wheezes, or rib retractions  Heart: Regular rate and rhythm, no murmurs or gallops Breast:Examined in sitting and supine position were symmetrical in appearance, no palpable masses or tenderness,  no skin retraction, no nipple inversion, no nipple discharge, no skin discoloration, no axillary or supraclavicular lymphadenopathy Abdomen: no palpable masses or tenderness, no rebound or guarding Extremities: no edema or skin discoloration or tenderness  Pelvic: Vulva: Normal             Vagina: No gross lesions or discharge.  Pap reflex done  Cervix/Uterus  Adnexa  Without masses or tenderness  Anus: Normal   Assessment/Plan:  57 y.o. female for annual exam   1. Well female exam with routine gynecological exam Gynecologic exam status post TLH/BSO.  Pap reflex on the vaginal vault.  Breast exam normal.  Screening mammogram negative in July 2019.  Colonoscopy in 2014.  Will do fasting health labs here today. - CBC - Comp Met (CMET) - Lipid panel - TSH - VITAMIN D 25 Hydroxy (Vit-D Deficiency, Fractures)  2. Vaginal Pap smear   3. Endometrial ca [facility]) Status post robotic TLH/BSO with Dr. Alycia Rossetti for endometrial adenocarcinoma stage  T1a in 2015.  Pap reflex done on the vaginal vault today.  4. Menopause present Well on no hormone replacement therapy.  Bone density done August 2018 was completely normal.  Recommend vitamin D supplements, calcium rich nutrition with or without supplements to reach at least 1.2 to 1.5 g/day and regular weightbearing physical activity.  5.  Family history of ovarian cancer Will do Ca125 today. - CA 125  Princess Bruins MD, 8:38 AM 04/17/2018

## 2018-04-18 LAB — PAP IG W/ RFLX HPV ASCU

## 2018-04-18 LAB — COMPREHENSIVE METABOLIC PANEL
AG RATIO: 1.8 (calc) (ref 1.0–2.5)
ALKALINE PHOSPHATASE (APISO): 69 U/L (ref 33–130)
ALT: 20 U/L (ref 6–29)
AST: 31 U/L (ref 10–35)
Albumin: 4.6 g/dL (ref 3.6–5.1)
BILIRUBIN TOTAL: 0.6 mg/dL (ref 0.2–1.2)
BUN: 21 mg/dL (ref 7–25)
CALCIUM: 10 mg/dL (ref 8.6–10.4)
CO2: 29 mmol/L (ref 20–32)
Chloride: 103 mmol/L (ref 98–110)
Creat: 1 mg/dL (ref 0.50–1.05)
Globulin: 2.5 g/dL (calc) (ref 1.9–3.7)
Glucose, Bld: 98 mg/dL (ref 65–99)
Potassium: 4.5 mmol/L (ref 3.5–5.3)
Sodium: 140 mmol/L (ref 135–146)
Total Protein: 7.1 g/dL (ref 6.1–8.1)

## 2018-04-18 LAB — CBC
HEMATOCRIT: 41 % (ref 35.0–45.0)
Hemoglobin: 13.8 g/dL (ref 11.7–15.5)
MCH: 30.3 pg (ref 27.0–33.0)
MCHC: 33.7 g/dL (ref 32.0–36.0)
MCV: 90.1 fL (ref 80.0–100.0)
MPV: 10.1 fL (ref 7.5–12.5)
Platelets: 229 10*3/uL (ref 140–400)
RBC: 4.55 10*6/uL (ref 3.80–5.10)
RDW: 12.5 % (ref 11.0–15.0)
WBC: 3.9 10*3/uL (ref 3.8–10.8)

## 2018-04-18 LAB — LIPID PANEL
CHOLESTEROL: 253 mg/dL — AB (ref <200)
HDL: 61 mg/dL (ref >50)
LDL Cholesterol (Calc): 176 mg/dL (calc) — ABNORMAL HIGH
Non-HDL Cholesterol (Calc): 192 mg/dL (calc) — ABNORMAL HIGH (ref <130)
TRIGLYCERIDES: 63 mg/dL (ref <150)
Total CHOL/HDL Ratio: 4.1 (calc) (ref <5.0)

## 2018-04-18 LAB — CA 125: CA 125: 8 U/mL (ref <35)

## 2018-04-18 LAB — TSH: TSH: 2.65 m[IU]/L (ref 0.40–4.50)

## 2018-04-18 LAB — VITAMIN D 25 HYDROXY (VIT D DEFICIENCY, FRACTURES): VIT D 25 HYDROXY: 27 ng/mL — AB (ref 30–100)

## 2018-04-22 DIAGNOSIS — M25551 Pain in right hip: Secondary | ICD-10-CM | POA: Diagnosis not present

## 2018-05-15 DIAGNOSIS — M79671 Pain in right foot: Secondary | ICD-10-CM | POA: Diagnosis not present

## 2018-05-15 DIAGNOSIS — M25551 Pain in right hip: Secondary | ICD-10-CM | POA: Diagnosis not present

## 2018-06-02 DIAGNOSIS — M25551 Pain in right hip: Secondary | ICD-10-CM | POA: Diagnosis not present

## 2018-06-02 DIAGNOSIS — M79671 Pain in right foot: Secondary | ICD-10-CM | POA: Diagnosis not present

## 2018-08-04 DIAGNOSIS — Z23 Encounter for immunization: Secondary | ICD-10-CM | POA: Diagnosis not present

## 2018-12-12 DIAGNOSIS — M79671 Pain in right foot: Secondary | ICD-10-CM | POA: Diagnosis not present

## 2019-04-21 ENCOUNTER — Encounter: Payer: 59 | Admitting: Obstetrics & Gynecology

## 2019-05-11 ENCOUNTER — Other Ambulatory Visit: Payer: Self-pay | Admitting: Obstetrics & Gynecology

## 2019-05-11 DIAGNOSIS — Z1231 Encounter for screening mammogram for malignant neoplasm of breast: Secondary | ICD-10-CM

## 2019-06-09 ENCOUNTER — Encounter: Payer: Self-pay | Admitting: Gynecology

## 2019-06-23 ENCOUNTER — Ambulatory Visit
Admission: RE | Admit: 2019-06-23 | Discharge: 2019-06-23 | Disposition: A | Discharge status: Home / Self Care | Payer: 59 | Source: Ambulatory Visit | Attending: Obstetrics & Gynecology | Admitting: Obstetrics & Gynecology

## 2019-06-23 ENCOUNTER — Other Ambulatory Visit: Payer: Self-pay

## 2019-06-23 DIAGNOSIS — Z1231 Encounter for screening mammogram for malignant neoplasm of breast: Secondary | ICD-10-CM

## 2019-06-25 ENCOUNTER — Other Ambulatory Visit: Payer: Self-pay | Admitting: Obstetrics & Gynecology

## 2019-06-25 DIAGNOSIS — R928 Other abnormal and inconclusive findings on diagnostic imaging of breast: Secondary | ICD-10-CM

## 2019-07-01 ENCOUNTER — Other Ambulatory Visit: Payer: Self-pay | Admitting: Obstetrics & Gynecology

## 2019-07-01 ENCOUNTER — Ambulatory Visit
Admission: RE | Admit: 2019-07-01 | Discharge: 2019-07-01 | Disposition: A | Discharge status: Home / Self Care | Payer: 59 | Source: Ambulatory Visit | Attending: Obstetrics & Gynecology | Admitting: Obstetrics & Gynecology

## 2019-07-01 ENCOUNTER — Other Ambulatory Visit: Payer: Self-pay

## 2019-07-01 DIAGNOSIS — R928 Other abnormal and inconclusive findings on diagnostic imaging of breast: Secondary | ICD-10-CM

## 2019-07-01 DIAGNOSIS — N6320 Unspecified lump in the left breast, unspecified quadrant: Secondary | ICD-10-CM

## 2019-07-02 ENCOUNTER — Ambulatory Visit
Admission: RE | Admit: 2019-07-02 | Discharge: 2019-07-02 | Disposition: A | Discharge status: Home / Self Care | Payer: 59 | Source: Ambulatory Visit | Attending: Obstetrics & Gynecology | Admitting: Obstetrics & Gynecology

## 2019-07-02 DIAGNOSIS — N6320 Unspecified lump in the left breast, unspecified quadrant: Secondary | ICD-10-CM

## 2019-07-02 HISTORY — PX: BREAST BIOPSY: SHX20

## 2020-02-05 ENCOUNTER — Encounter: Payer: Self-pay | Admitting: Obstetrics & Gynecology

## 2020-03-17 NOTE — Progress Notes (Signed)
Subjective:    Patient ID: Tracie Hahn, female    DOB: 04/19/1961, 59 y.o.   MRN: [id number]  HPI  She is here to establish with a new pcp.  She is here for a physical exam.   She does not have a routine PCP and wanted to get established.  Overall she is healthy and she has no major concerns.  She does follow with her gynecologist.  Medications and allergies reviewed with patient and updated if appropriate.  Patient Active Problem List   Diagnosis Date Noted  . Vitamin D deficiency 03/18/2020  . Primary osteoarthritis of first carpometacarpal joint of left hand 06/26/2017  . History of endometrial cancer 07/14/2014  . Status post laparoscopic hysterectomy 06/12/2014  . 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   No current facility-administered medications on file prior to visit.    Past Medical History:  Diagnosis Date  . Cancer [facility])    endometrial  . Ventral hernia     Past Surgical History:  Procedure Laterality Date  . ABDOMINAL HYSTERECTOMY  2015   endometrial ca  . BREAST BIOPSY Right   . BREAST SURGERY  2014   right breast biopsy  . COLONOSCOPY    . DIAGNOSTIC LAPAROSCOPY  2011   right ovary removed  . DILATION AND CURETTAGE OF UTERUS  [id number]  . DILITATION & CURRETTAGE/HYSTROSCOPY WITH VERSAPOINT RESECTION N/A 05/07/2014   Procedure: DILATATION & CURETTAGE/HYSTEROSCOPY WITH VERSAPOINT RESECTION;  Surgeon: Princess Bruins, MD;  Location: [facility];  Service: Gynecology;  Laterality: N/A;  . EYE SURGERY  2008   lasik -bilateral  . INSERTION OF MESH N/A 10/26/2016   Procedure: INSERTION OF MESH;  Surgeon: Autumn Messing III, MD;  Location: [place];  Service: General;  Laterality: N/A;  . left elbow surgery  08/2010   ulner nerve  . oohorectomy Right 2011  . VENTRAL HERNIA REPAIR N/A 10/26/2016   Procedure: LAPAROSCOPIC VENTRAL HERNIA;  Surgeon: Autumn Messing III, MD;  Location: [place];   Service: General;  Laterality: N/A;  . [place]    Social History   Socioeconomic History  . Marital status: Single    Spouse name: Not on file  . Number of children: Not on file  . Years of education: Not on file  . Highest education level: Not on file  Occupational History  . Not on file  Tobacco Use  . Smoking status: Never Smoker  . Smokeless tobacco: Never Used  Vaping Use  . Vaping Use: Never used  Substance and Sexual Activity  . Alcohol use: Yes    Alcohol/week: 3.0 - 4.0 standard drinks    Types: 3 - 4 Glasses of wine per week    Comment: 3-4  . Drug use: No  . Sexual activity: Yes    Partners: Male, Female    Comment: 1st intercourse- 47, partners- ?, 24 yrs current relationship   Other Topics Concern  . Not on file  Social History Narrative  . Not on file   Social Determinants of Health   Financial Resource Strain:   . Difficulty of Paying Living Expenses:   Food Insecurity:   . Worried About Charity fundraiser in the Last Year:   . Arboriculturist in the Last Year:   Transportation Needs:   . Film/video editor (Medical):   Marland Kitchen Lack of Transportation (Non-Medical):   Physical Activity:   .  Days of Exercise per Week:   . Minutes of Exercise per Session:   Stress:   . Feeling of Stress :   Social Connections:   . Frequency of Communication with Friends and Family:   . Frequency of Social Gatherings with Friends and Family:   . Attends Religious Services:   . Active Member of Clubs or Organizations:   . Attends Archivist Meetings:   Marland Kitchen Marital Status:     Family History  Problem Relation Age of Onset  . Cancer Maternal Aunt 40       BRCA positive breast and ovarian  . Breast cancer Maternal Aunt   . Cancer Maternal Uncle 4       BRCA 1 & 2 positive female breast and prostate cancer  . Breast cancer Maternal Uncle   . Cancer Maternal Grandmother 12       ovarian cancer  . Cancer Mother   . Breast cancer  Mother     Review of Systems  Constitutional: Negative for chills and fever.  Eyes: Negative for visual disturbance.  Respiratory: Negative for cough, shortness of breath and wheezing.   Cardiovascular: Negative for chest pain, palpitations and leg swelling.  Gastrointestinal: Negative for abdominal pain, blood in stool, constipation, diarrhea and nausea.       Occ gerd  Genitourinary: Negative for dysuria and hematuria.  Musculoskeletal: Positive for arthralgias (bl hip and knee pain - mild).  Skin: Negative for rash.  Neurological: Negative for dizziness, light-headedness and headaches.  Psychiatric/Behavioral: Negative for dysphoric mood. The patient is not nervous/anxious.        Objective:   Vitals:   03/18/20 1404  BP: 120/76  Pulse: 75  Temp: 97.6 F (36.4 C)  SpO2: 96%   Filed Weights   03/18/20 1404  Weight: 183 lb (83 kg)   Body mass index is 27.02 kg/m.  BP Readings from Last 3 Encounters:  03/18/20 120/76  04/17/18 108/68  10/26/16 97/63    Wt Readings from Last 3 Encounters:  03/18/20 183 lb (83 kg)  04/17/18 178 lb (80.7 kg)  04/12/17 176 lb (79.8 kg)     Physical Exam Constitutional: She appears well-developed and well-nourished. No distress.  HENT:  Head: Normocephalic and atraumatic.  Right Ear: External ear normal. Normal ear canal and TM Left Ear: External ear normal.  Normal ear canal and TM Mouth/Throat: Oropharynx is clear and moist.  Eyes: Conjunctivae and EOM are normal.  Neck: Neck supple. No tracheal deviation present. No thyromegaly present.  No carotid bruit  Cardiovascular: Normal rate, regular rhythm and normal heart sounds.  No murmur heard.  No edema. Pulmonary/Chest: Effort normal and breath sounds normal. No respiratory distress. She has no wheezes. She has no rales.  Breast: deferred   Abdominal: Soft. She exhibits no distension. There is no tenderness.  Lymphadenopathy: She has no cervical adenopathy.  Skin: Skin is  warm and dry. She is not diaphoretic.  Psychiatric: She has a normal mood and affect. Her behavior is normal.        Assessment & Plan:   Physical exam: Screening blood work    ordered Immunizations  tdap today, others up-to-date Colonoscopy  May - dr Collene Mares  Mammogram  Up to date  Gyn  Up to date  Eye exams  Up to date  Exercise regular-walking, golf, pickleball Weight discussed weight loss-she does want to lose weight and will work on it Substance abuse  none  See Problem List for Assessment  and Plan of chronic medical problems.   This visit occurred during the SARS-CoV-2 public health emergency.  Safety protocols were in place, including screening questions prior to the visit, additional usage of staff PPE, and extensive cleaning of exam room while observing appropriate contact time as indicated for disinfecting solutions.

## 2020-03-18 ENCOUNTER — Other Ambulatory Visit: Payer: Self-pay

## 2020-03-18 ENCOUNTER — Ambulatory Visit (INDEPENDENT_AMBULATORY_CARE_PROVIDER_SITE_OTHER): Payer: 59 | Admitting: Internal Medicine

## 2020-03-18 ENCOUNTER — Encounter: Payer: Self-pay | Admitting: Internal Medicine

## 2020-03-18 VITALS — BP 120/76 | HR 75 | Temp 97.6°F | Ht 69.0 in | Wt 183.0 lb

## 2020-03-18 DIAGNOSIS — Z0000 Encounter for general adult medical examination without abnormal findings: Secondary | ICD-10-CM

## 2020-03-18 DIAGNOSIS — Z8542 Personal history of malignant neoplasm of other parts of uterus: Secondary | ICD-10-CM | POA: Diagnosis not present

## 2020-03-18 DIAGNOSIS — Z23 Encounter for immunization: Secondary | ICD-10-CM | POA: Diagnosis not present

## 2020-03-18 DIAGNOSIS — E559 Vitamin D deficiency, unspecified: Secondary | ICD-10-CM | POA: Insufficient documentation

## 2020-03-18 MED ORDER — EPINEPHRINE 0.3 MG/0.3ML IJ SOAJ: 0.3000 mg | INTRAMUSCULAR | 3 refills | Status: AC | PRN

## 2020-03-18 NOTE — Assessment & Plan Note (Signed)
Chronic History of vitamin D deficiency Not taking vitamin D regularly Check vitamin D level

## 2020-03-18 NOTE — Patient Instructions (Addendum)
Blood work was ordered.    All other Health Maintenance issues reviewed.   All recommended immunizations and age-appropriate screenings are up-to-date or discussed.  Tetanus  immunization administered today.   Medications reviewed and updated.  Changes include :   None  Your prescription(s) have been submitted to your pharmacy. Please take as directed and contact our office if you believe you are having problem(s) with the medication(s).   Please followup in 1 year    Health Maintenance, Female Adopting a healthy lifestyle and getting preventive care are important in promoting health and wellness. Ask your health care provider about:  The right schedule for you to have regular tests and exams.  Things you can do on your own to prevent diseases and keep yourself healthy. What should I know about diet, weight, and exercise? Eat a healthy diet   Eat a diet that includes plenty of vegetables, fruits, low-fat dairy products, and lean protein.  Do not eat a lot of foods that are high in solid fats, added sugars, or sodium. Maintain a healthy weight Body mass index (BMI) is used to identify weight problems. It estimates body fat based on height and weight. Your health care provider can help determine your BMI and help you achieve or maintain a healthy weight. Get regular exercise Get regular exercise. This is one of the most important things you can do for your health. Most adults should:  Exercise for at least 150 minutes each week. The exercise should increase your heart rate and make you sweat (moderate-intensity exercise).  Do strengthening exercises at least twice a week. This is in addition to the moderate-intensity exercise.  Spend less time sitting. Even light physical activity can be beneficial. Watch cholesterol and blood lipids Have your blood tested for lipids and cholesterol at 58 years of age, then have this test every 5 years. Have your cholesterol levels checked  more often if:  Your lipid or cholesterol levels are high.  You are older than 58 years of age.  You are at high risk for heart disease. What should I know about cancer screening? Depending on your health history and family history, you may need to have cancer screening at various ages. This may include screening for:  Breast cancer.  Cervical cancer.  Colorectal cancer.  Skin cancer.  Lung cancer. What should I know about heart disease, diabetes, and high blood pressure? Blood pressure and heart disease  High blood pressure causes heart disease and increases the risk of stroke. This is more likely to develop in people who have high blood pressure readings, are of African descent, or are overweight.  Have your blood pressure checked: ? Every 3-5 years if you are 62-35 years of age. ? Every year if you are 68 years old or older. Diabetes Have regular diabetes screenings. This checks your fasting blood sugar level. Have the screening done:  Once every three years after age 36 if you are at a normal weight and have a low risk for diabetes.  More often and at a younger age if you are overweight or have a high risk for diabetes. What should I know about preventing infection? Hepatitis B If you have a higher risk for hepatitis B, you should be screened for this virus. Talk with your health care provider to find out if you are at risk for hepatitis B infection. Hepatitis C Testing is recommended for:  Everyone born from [birth year] through 1964.  Anyone with known risk factors for hepatitis  Sexually transmitted infections (STIs)  Get screened for STIs, including gonorrhea and chlamydia, if: ? You are sexually active and are younger than 58 years of age. ? You are older than 58 years of age and your health care provider tells you that you are at risk for this type of infection. ? Your sexual activity has changed since you were last screened, and you are at increased risk for  chlamydia or gonorrhea. Ask your health care provider if you are at risk.  Ask your health care provider about whether you are at high risk for HIV. Your health care provider may recommend a prescription medicine to help prevent HIV infection. If you choose to take medicine to prevent HIV, you should first get tested for HIV. You should then be tested every 3 months for as long as you are taking the medicine. Pregnancy  If you are about to stop having your period (premenopausal) and you may become pregnant, seek counseling before you get pregnant.  Take 400 to 800 micrograms (mcg) of folic acid every day if you become pregnant.  Ask for birth control (contraception) if you want to prevent pregnancy. Osteoporosis and menopause Osteoporosis is a disease in which the bones lose minerals and strength with aging. This can result in bone fractures. If you are 65 years old or older, or if you are at risk for osteoporosis and fractures, ask your health care provider if you should:  Be screened for bone loss.  Take a calcium or vitamin D supplement to lower your risk of fractures.  Be given hormone replacement therapy (HRT) to treat symptoms of menopause. Follow these instructions at home: Lifestyle  Do not use any products that contain nicotine or tobacco, such as cigarettes, e-cigarettes, and chewing tobacco. If you need help quitting, ask your health care provider.  Do not use street drugs.  Do not share needles.  Ask your health care provider for help if you need support or information about quitting drugs. Alcohol use  Do not drink alcohol if: ? Your health care provider tells you not to drink. ? You are pregnant, may be pregnant, or are planning to become pregnant.  If you drink alcohol: ? Limit how much you use to 0-1 drink a day. ? Limit intake if you are breastfeeding.  Be aware of how much alcohol is in your drink. In the [place] one drink equals one 12 oz bottle of beer (355  mL), one 5 oz glass of wine (148 mL), or one 1 oz glass of hard liquor (44 mL). General instructions  Schedule regular health, dental, and eye exams.  Stay current with your vaccines.  Tell your health care provider if: ? You often feel depressed. ? You have ever been abused or do not feel safe at home. Summary  Adopting a healthy lifestyle and getting preventive care are important in promoting health and wellness.  Follow your health care provider's instructions about healthy diet, exercising, and getting tested or screened for diseases.  Follow your health care provider's instructions on monitoring your cholesterol and blood pressure. This information is not intended to replace advice given to you by your health care provider. Make sure you discuss any questions you have with your health care provider. Document Revised: 08/20/2018 Document Reviewed: 08/20/2018 Elsevier Patient Education  2020 Elsevier Inc.  

## 2020-03-18 NOTE — Assessment & Plan Note (Signed)
Status post surgery-no additional treatment needed We will check CA-125 Following with gynecology

## 2020-03-22 ENCOUNTER — Other Ambulatory Visit: Payer: 59

## 2020-03-22 ENCOUNTER — Other Ambulatory Visit: Payer: Self-pay

## 2020-03-22 DIAGNOSIS — Z8542 Personal history of malignant neoplasm of other parts of uterus: Secondary | ICD-10-CM

## 2020-03-22 DIAGNOSIS — Z0000 Encounter for general adult medical examination without abnormal findings: Secondary | ICD-10-CM

## 2020-03-22 DIAGNOSIS — E559 Vitamin D deficiency, unspecified: Secondary | ICD-10-CM

## 2020-03-23 LAB — LIPID PANEL
Cholesterol: 259 mg/dL — ABNORMAL HIGH (ref <200)
HDL: 57 mg/dL (ref >=50)
LDL Cholesterol (Calc): 173 mg/dL (calc) — ABNORMAL HIGH
Non-HDL Cholesterol (Calc): 202 mg/dL (calc) — ABNORMAL HIGH (ref <130)
Total CHOL/HDL Ratio: 4.5 (calc) (ref <5.0)
Triglycerides: 144 mg/dL (ref <150)

## 2020-03-23 LAB — CBC WITH DIFFERENTIAL/PLATELET
Absolute Monocytes: 304 cells/uL (ref 200–950)
Basophils Absolute: 19 cells/uL (ref 0–200)
Basophils Relative: 0.6 %
Eosinophils Absolute: 131 cells/uL (ref 15–500)
Eosinophils Relative: 4.1 %
HCT: 39.8 % (ref 35.0–45.0)
Hemoglobin: 13.5 g/dL (ref 11.7–15.5)
Lymphs Abs: 963 cells/uL (ref 850–3900)
MCH: 30.5 pg (ref 27.0–33.0)
MCHC: 33.9 g/dL (ref 32.0–36.0)
MCV: 90 fL (ref 80.0–100.0)
MPV: 9.9 fL (ref 7.5–12.5)
Monocytes Relative: 9.5 %
Neutro Abs: 1782 cells/uL (ref 1500–7800)
Neutrophils Relative %: 55.7 %
Platelets: 186 10*3/uL (ref 140–400)
RBC: 4.42 10*6/uL (ref 3.80–5.10)
RDW: 13.7 % (ref 11.0–15.0)
Total Lymphocyte: 30.1 %
WBC: 3.2 10*3/uL — ABNORMAL LOW (ref 3.8–10.8)

## 2020-03-23 LAB — CA 125: CA 125: 11 U/mL (ref <35)

## 2020-03-23 LAB — COMPREHENSIVE METABOLIC PANEL
AG Ratio: 1.8 (calc) (ref 1.0–2.5)
ALT: 22 U/L (ref 6–29)
AST: 19 U/L (ref 10–35)
Albumin: 4.4 g/dL (ref 3.6–5.1)
Alkaline phosphatase (APISO): 68 U/L (ref 37–153)
BUN: 16 mg/dL (ref 7–25)
CO2: 27 mmol/L (ref 20–32)
Calcium: 9.5 mg/dL (ref 8.6–10.4)
Chloride: 104 mmol/L (ref 98–110)
Creat: 0.95 mg/dL (ref 0.50–1.05)
Globulin: 2.4 g/dL (calc) (ref 1.9–3.7)
Glucose, Bld: 106 mg/dL — ABNORMAL HIGH (ref 65–99)
Potassium: 4.2 mmol/L (ref 3.5–5.3)
Sodium: 140 mmol/L (ref 135–146)
Total Bilirubin: 0.5 mg/dL (ref 0.2–1.2)
Total Protein: 6.8 g/dL (ref 6.1–8.1)

## 2020-03-23 LAB — TSH: TSH: 2.54 mIU/L (ref 0.40–4.50)

## 2020-03-23 LAB — VITAMIN D 25 HYDROXY (VIT D DEFICIENCY, FRACTURES): Vit D, 25-Hydroxy: 19 ng/mL — ABNORMAL LOW (ref 30–100)

## 2020-03-25 ENCOUNTER — Encounter: Payer: Self-pay | Admitting: Internal Medicine

## 2020-03-25 DIAGNOSIS — E785 Hyperlipidemia, unspecified: Secondary | ICD-10-CM | POA: Insufficient documentation

## 2020-03-29 ENCOUNTER — Encounter: Payer: Self-pay | Admitting: Internal Medicine

## 2020-03-29 DIAGNOSIS — R739 Hyperglycemia, unspecified: Secondary | ICD-10-CM

## 2020-03-29 DIAGNOSIS — E7849 Other hyperlipidemia: Secondary | ICD-10-CM

## 2020-04-06 DIAGNOSIS — R7303 Prediabetes: Secondary | ICD-10-CM | POA: Insufficient documentation

## 2020-04-06 MED ORDER — ROSUVASTATIN CALCIUM 5 MG PO TABS
5.0000 mg | ORAL_TABLET | Freq: Every day | ORAL | 3 refills | Status: DC
Start: 2020-04-06 — End: 2020-05-19

## 2020-04-06 NOTE — Addendum Note (Signed)
Addended by: Binnie Rail on: 04/06/2020 07:35 AM   Modules accepted: Orders

## 2020-05-18 ENCOUNTER — Other Ambulatory Visit: Payer: Self-pay | Admitting: Internal Medicine

## 2020-05-18 DIAGNOSIS — Z1231 Encounter for screening mammogram for malignant neoplasm of breast: Secondary | ICD-10-CM

## 2020-05-19 ENCOUNTER — Other Ambulatory Visit: Payer: Self-pay | Admitting: *Deleted

## 2020-05-19 MED ORDER — ROSUVASTATIN CALCIUM 5 MG PO TABS: 5.0000 mg | ORAL_TABLET | Freq: Every day | ORAL | 2 refills | Status: DC

## 2020-06-30 ENCOUNTER — Other Ambulatory Visit: Payer: Self-pay

## 2020-06-30 ENCOUNTER — Ambulatory Visit
Admission: RE | Admit: 2020-06-30 | Discharge: 2020-06-30 | Disposition: A | Discharge status: Home / Self Care | Payer: 59 | Source: Ambulatory Visit | Attending: Internal Medicine | Admitting: Internal Medicine

## 2020-06-30 DIAGNOSIS — Z1231 Encounter for screening mammogram for malignant neoplasm of breast: Secondary | ICD-10-CM

## 2020-08-08 ENCOUNTER — Telehealth: Payer: Self-pay | Admitting: Internal Medicine

## 2020-08-08 DIAGNOSIS — E7849 Other hyperlipidemia: Secondary | ICD-10-CM

## 2020-08-08 DIAGNOSIS — R739 Hyperglycemia, unspecified: Secondary | ICD-10-CM

## 2020-08-08 NOTE — Telephone Encounter (Signed)
Blood work ordered for GV - pls schedule

## 2020-08-08 NOTE — Telephone Encounter (Signed)
My-chart message sent to patient to let me know closer to appointment date in 10/2020 so I could set up lab appointment.

## 2020-08-08 NOTE — Telephone Encounter (Signed)
Patient has an appointment on 02.10.22 for a follow up on her rosuvastatin (CRESTOR) 5 MG tablet and she was wondering if she could go before her appointment to get her lab work done so she could discuss those results with Dr. Quay Burow.

## 2020-10-13 ENCOUNTER — Other Ambulatory Visit (INDEPENDENT_AMBULATORY_CARE_PROVIDER_SITE_OTHER): Payer: 59

## 2020-10-13 ENCOUNTER — Other Ambulatory Visit: Payer: 59

## 2020-10-13 DIAGNOSIS — R739 Hyperglycemia, unspecified: Secondary | ICD-10-CM | POA: Diagnosis not present

## 2020-10-13 DIAGNOSIS — E7849 Other hyperlipidemia: Secondary | ICD-10-CM

## 2020-10-13 LAB — LIPID PANEL
Cholesterol: 181 mg/dL (ref 0–200)
HDL: 55 mg/dL (ref >39.00)
LDL Cholesterol: 99 mg/dL (ref 0–99)
NonHDL: 125.64
Total CHOL/HDL Ratio: 3
Triglycerides: 133 mg/dL (ref 0.0–149.0)
VLDL: 26.6 mg/dL (ref 0.0–40.0)

## 2020-10-13 LAB — HEPATIC FUNCTION PANEL
ALT: 23 U/L (ref 0–35)
AST: 18 U/L (ref 0–37)
Albumin: 4.4 g/dL (ref 3.5–5.2)
Alkaline Phosphatase: 58 U/L (ref 39–117)
Bilirubin, Direct: 0.1 mg/dL (ref 0.0–0.3)
Total Bilirubin: 0.7 mg/dL (ref 0.2–1.2)
Total Protein: 6.9 g/dL (ref 6.0–8.3)

## 2020-10-13 LAB — HEMOGLOBIN A1C: Hgb A1c MFr Bld: 5.8 % (ref 4.6–6.5)

## 2020-10-15 DIAGNOSIS — K5730 Diverticulosis of large intestine without perforation or abscess without bleeding: Secondary | ICD-10-CM | POA: Insufficient documentation

## 2020-10-15 NOTE — Patient Instructions (Addendum)
     Medications changes include :  none      Please followup in 1 year  

## 2020-10-15 NOTE — Progress Notes (Signed)
Subjective:    Patient ID: Tracie Hahn, female    DOB: August 08, 1961, 60 y.o.   MRN: [id number]  HPI The patient is here for follow up of their chronic medical problems, including hyperlipidemia, vitamin d def, prediabetes      Medications and allergies reviewed with patient and updated if appropriate.  Patient Active Problem List   Diagnosis Date Noted  . 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 Prediabetes 04/06/2020  . Hyperlipidemia 03/25/2020  . Vitamin D deficiency 03/18/2020  . Primary osteoarthritis of first carpometacarpal joint of left hand 06/26/2017  . History of endometrial cancer 07/14/2014  . Status post laparoscopic hysterectomy 06/12/2014    Current Outpatient Medications on File Prior to Visit  Medication Sig Dispense Refill  . clobetasol cream (TEMOVATE) 0.05 % APPLY TOPICALLY TO THE AFFECTED AREA TWICE DAILY    . EPINEPHrine 0.3 mg/0.3 mL IJ SOAJ injection Inject 0.3 mLs (0.3 mg total) into the muscle as needed for anaphylaxis. 1 each 3  . Multiple Vitamin (MULTIVITAMIN ADULT PO)     . rosuvastatin (CRESTOR) 5 MG tablet Take 1 tablet (5 mg total) by mouth daily. 90 tablet 2   No current facility-administered medications on file prior to visit.    Past Medical History:  Diagnosis Date  . Cancer [facility])    endometrial  . Ventral hernia     Past Surgical History:  Procedure Laterality Date  . ABDOMINAL HYSTERECTOMY  2015   endometrial ca  . BREAST BIOPSY Right    FIBROCYSTIC CHANGE   . BREAST SURGERY  2014   right breast biopsy  . COLONOSCOPY    . DIAGNOSTIC LAPAROSCOPY  2011   right ovary removed  . DILATION AND CURETTAGE OF UTERUS  [id number]  . DILITATION & CURRETTAGE/HYSTROSCOPY WITH VERSAPOINT RESECTION N/A 05/07/2014   Procedure: DILATATION & CURETTAGE/HYSTEROSCOPY WITH VERSAPOINT RESECTION;  Surgeon: Princess Bruins, MD;  Location: [facility];  Service: Gynecology;  Laterality: N/A;  . EYE SURGERY  2008   lasik -bilateral  .  INSERTION OF MESH N/A 10/26/2016   Procedure: INSERTION OF MESH;  Surgeon: Autumn Messing III, MD;  Location: [facility];  Service: General;  Laterality: N/A;  . left elbow surgery  08/2010   ulner nerve  . oohorectomy Right 2011  . VENTRAL HERNIA REPAIR N/A 10/26/2016   Procedure: LAPAROSCOPIC VENTRAL HERNIA;  Surgeon: Autumn Messing III, MD;  Location: [facility];  Service: General;  Laterality: N/A;  . [place]    Social History   Socioeconomic History  . Marital status: Married    Spouse name: Not on file  . Number of children: Not on file  . Years of education: Not on file  . Highest education level: Not on file  Occupational History  . Not on file  Tobacco Use  . Smoking status: Never Smoker  . Smokeless tobacco: Never Used  Vaping Use  . Vaping Use: Never used  Substance and Sexual Activity  . Alcohol use: Yes    Alcohol/week: 3.0 - 4.0 standard drinks    Types: 3 - 4 Glasses of wine per week    Comment: 3-4  . Drug use: No  . Sexual activity: Yes    Partners: Male, Female    Comment: 1st intercourse- 89, partners- ?, 24 yrs current relationship   Other Topics Concern  . Not on file  Social History Narrative  . Not on file   Social Determinants of Health   Financial Resource  Strain: Not on file  Food Insecurity: Not on file  Transportation Needs: Not on file  Physical Activity: Not on file  Stress: Not on file  Social Connections: Not on file    Family History  Problem Relation Age of Onset  . Cancer Maternal Aunt 40       BRCA positive breast and ovarian  . Breast cancer Maternal Aunt   . Cancer Maternal Uncle 42       BRCA 1 & 2 positive female breast and prostate cancer  . Breast cancer Maternal Uncle   . Cancer Maternal Grandmother 75       ovarian cancer  . Cancer Mother     Review of Systems  Constitutional: Negative for fever.  Respiratory: Negative for shortness of breath.   Cardiovascular: Negative for chest pain, palpitations and leg  swelling.  Neurological: Positive for headaches (rare). Negative for dizziness.       Objective:   Vitals:   10/20/20 0745  BP: 130/78  Pulse: 77  Temp: 98 F (36.7 C)  SpO2: 97%   BP Readings from Last 3 Encounters:  10/20/20 130/78  03/18/20 120/76  04/17/18 108/68   Wt Readings from Last 3 Encounters:  10/20/20 197 lb 6.4 oz (89.5 kg)  03/18/20 183 lb (83 kg)  04/17/18 178 lb (80.7 kg)   Body mass index is 29.15 kg/m.   Physical Exam    Constitutional: Appears well-developed and well-nourished. No distress.  HENT:  Head: Normocephalic and atraumatic.  Neck: Neck supple. No tracheal deviation present. No thyromegaly present.  No cervical lymphadenopathy Cardiovascular: Normal rate, regular rhythm and normal heart sounds.   No murmur heard. No carotid bruit .  No edema Pulmonary/Chest: Effort normal and breath sounds normal. No respiratory distress. No has no wheezes. No rales.  Skin: Skin is warm and dry. Not diaphoretic.  Psychiatric: Normal mood and affect. Behavior is normal.      Assessment & Plan:    See Problem List for Assessment and Plan of chronic medical problems.    This visit occurred during the SARS-CoV-2 public health emergency.  Safety protocols were in place, including screening questions prior to the visit, additional usage of staff PPE, and extensive cleaning of exam room while observing appropriate contact time as indicated for disinfecting solutions.

## 2020-10-20 ENCOUNTER — Encounter: Payer: Self-pay | Admitting: Internal Medicine

## 2020-10-20 ENCOUNTER — Other Ambulatory Visit: Payer: Self-pay

## 2020-10-20 ENCOUNTER — Ambulatory Visit (INDEPENDENT_AMBULATORY_CARE_PROVIDER_SITE_OTHER): Payer: 59 | Admitting: Internal Medicine

## 2020-10-20 VITALS — BP 130/78 | HR 77 | Temp 98.0°F | Ht 69.0 in | Wt 197.4 lb

## 2020-10-20 DIAGNOSIS — R7303 Prediabetes: Secondary | ICD-10-CM | POA: Diagnosis not present

## 2020-10-20 DIAGNOSIS — L309 Dermatitis, unspecified: Secondary | ICD-10-CM | POA: Insufficient documentation

## 2020-10-20 DIAGNOSIS — E7849 Other hyperlipidemia: Secondary | ICD-10-CM

## 2020-10-20 DIAGNOSIS — E559 Vitamin D deficiency, unspecified: Secondary | ICD-10-CM

## 2020-10-20 NOTE — Assessment & Plan Note (Signed)
Chronic  Lab Results  Component Value Date   HGBA1C 5.8 10/13/2020   Low sugar/carb diet Regular exercise

## 2020-10-20 NOTE — Assessment & Plan Note (Signed)
Chronic Taking vitamin d daily - occasionally misses Will check at her next visit

## 2020-10-20 NOTE — Assessment & Plan Note (Signed)
Chronic Lipids well controlled Continue crestor 5 mg daily Regular exercise and healthy diet encouraged

## 2020-12-29 ENCOUNTER — Other Ambulatory Visit: Payer: Self-pay | Admitting: Internal Medicine

## 2021-03-23 ENCOUNTER — Encounter: Payer: 59 | Admitting: Internal Medicine

## 2021-03-25 ENCOUNTER — Other Ambulatory Visit: Payer: Self-pay | Admitting: Internal Medicine

## 2021-04-05 NOTE — Progress Notes (Signed)
Subjective:    Patient ID: Tracie Hahn, female    DOB: 10/29/1960, 60 y.o.   MRN: [id number]   This visit occurred during the SARS-CoV-2 public health emergency.  Safety protocols were in place, including screening questions prior to the visit, additional usage of staff PPE, and extensive cleaning of exam room while observing appropriate contact time as indicated for disinfecting solutions.    HPI She is here for a physical exam.   Overall doing well-no major concerns.  She does have increased stress with work, but is handling it well and is not concerned.  Medications and allergies reviewed with patient and updated if appropriate.  Patient Active Problem List   Diagnosis Date Noted   Dermatitis 10/20/2020   Diverticular disease of colon 10/15/2020   Prediabetes 04/06/2020   Hyperlipidemia 03/25/2020   Vitamin D deficiency 03/18/2020   Primary osteoarthritis of first carpometacarpal joint of left hand 06/26/2017   History of endometrial cancer 07/14/2014   Status post laparoscopic hysterectomy 06/12/2014    Current Outpatient Medications on File Prior to Visit  Medication Sig Dispense Refill   clobetasol cream (TEMOVATE) 0.05 % APPLY TOPICALLY TO THE AFFECTED AREA TWICE DAILY     EPINEPHrine 0.3 mg/0.3 mL IJ SOAJ injection Inject 0.3 mLs (0.3 mg total) into the muscle as needed for anaphylaxis. 1 each 3   Multiple Vitamin (MULTIVITAMIN ADULT PO)      rosuvastatin (CRESTOR) 5 MG tablet TAKE 1 TABLET BY MOUTH  DAILY 90 tablet 3   No current facility-administered medications on file prior to visit.    Past Medical History:  Diagnosis Date   Cancer [facility])    endometrial   Ventral hernia     Past Surgical History:  Procedure Laterality Date   ABDOMINAL HYSTERECTOMY  2015   endometrial ca   BREAST BIOPSY Right    FIBROCYSTIC CHANGE    BREAST SURGERY  2014   right breast biopsy   COLONOSCOPY     DIAGNOSTIC LAPAROSCOPY  2011   right ovary removed   DILATION  AND CURETTAGE OF UTERUS  [id number]   DILITATION & CURRETTAGE/HYSTROSCOPY WITH VERSAPOINT RESECTION N/A 05/07/2014   Procedure: DILATATION & CURETTAGE/HYSTEROSCOPY WITH VERSAPOINT RESECTION;  Surgeon: Princess Bruins, MD;  Location: [facility];  Service: Gynecology;  Laterality: N/A;   EYE SURGERY  2008   lasik -bilateral   INSERTION OF MESH N/A 10/26/2016   Procedure: INSERTION OF MESH;  Surgeon: Autumn Messing III, MD;  Location: [facility];  Service: General;  Laterality: N/A;   left elbow surgery  08/2010   ulner nerve   oohorectomy Right 2011   VENTRAL HERNIA REPAIR N/A 10/26/2016   Procedure: LAPAROSCOPIC VENTRAL HERNIA;  Surgeon: Autumn Messing III, MD;  Location: [facility];  Service: General;  Laterality: N/A;   WISDOM TOOTH EXTRACTION  1981    Social History   Socioeconomic History   Marital status: Married    Spouse name: Not on file   Number of children: Not on file   Years of education: Not on file   Highest education level: Not on file  Occupational History   Not on file  Tobacco Use   Smoking status: Never   Smokeless tobacco: Never  Vaping Use   Vaping Use: Never used  Substance and Sexual Activity   Alcohol use: Yes    Alcohol/week: 3.0 - 4.0 standard drinks    Types: 3 - 4 Glasses of wine per week    Comment: 3-4  Drug use: No   Sexual activity: Yes    Partners: Male, Female    Comment: 1st intercourse- 21, partners- ?, 24 yrs current relationship   Other Topics Concern   Not on file  Social History Narrative   Not on file   Social Determinants of Health   Financial Resource Strain: Not on file  Food Insecurity: Not on file  Transportation Needs: Not on file  Physical Activity: Not on file  Stress: Not on file  Social Connections: Not on file    Family History  Problem Relation Age of Onset   Cancer Maternal Aunt 40       BRCA positive breast and ovarian   Breast cancer Maternal Aunt    Cancer Maternal Uncle 30       BRCA 1 & 2 positive female breast and prostate  cancer   Breast cancer Maternal Uncle    Cancer Maternal Grandmother 21       ovarian cancer   Cancer Mother     Review of Systems  Constitutional:  Negative for chills and fever.  Eyes:  Negative for visual disturbance.  Respiratory:  Negative for cough, shortness of breath and wheezing.   Cardiovascular:  Negative for chest pain, palpitations and leg swelling.  Gastrointestinal:  Negative for abdominal pain, blood in stool, constipation, diarrhea and nausea.       No gerd  Genitourinary:  Negative for dysuria and hematuria.  Musculoskeletal:  Positive for arthralgias (mild knees).  Skin:  Negative for color change and rash.  Neurological:  Positive for headaches. Negative for dizziness and light-headedness.  Psychiatric/Behavioral:  Negative for dysphoric mood and sleep disturbance. The patient is nervous/anxious.       Objective:   Vitals:   04/06/21 0751  BP: 116/80  Pulse: 67  Temp: 98.2 F (36.8 C)  SpO2: 97%   Filed Weights   04/06/21 0751  Weight: 194 lb (88 kg)   Body mass index is 28.65 kg/m.  BP Readings from Last 3 Encounters:  04/06/21 116/80  10/20/20 130/78  03/18/20 120/76    Wt Readings from Last 3 Encounters:  04/06/21 194 lb (88 kg)  10/20/20 197 lb 6.4 oz (89.5 kg)  03/18/20 183 lb (83 kg)    Depression screen [facility] 2/9 04/06/2021 03/18/2020  Decreased Interest 0 0  Down, Depressed, Hopeless 0 0  PHQ - 2 Score 0 0  Altered sleeping 0 -  Tired, decreased energy 0 -  Change in appetite 0 -  Feeling bad or failure about yourself  0 -  Trouble concentrating 0 -  Moving slowly or fidgety/restless 0 -  Suicidal thoughts 0 -  PHQ-9 Score 0 -    GAD 7 : Generalized Anxiety Score 04/06/2021  Nervous, Anxious, on Edge 1  Control/stop worrying 0  Worry too much - different things 0  Trouble relaxing 1  Restless 1  Easily annoyed or irritable 1  Afraid - awful might happen 0  Total GAD 7 Score 4  Anxiety Difficulty Not difficult at all        Physical Exam Constitutional: She appears well-developed and well-nourished. No distress.  HENT:  Head: Normocephalic and atraumatic.  Right Ear: External ear normal. Normal ear canal and TM Left Ear: External ear normal.  Normal ear canal and TM Mouth/Throat: Oropharynx is clear and moist.  Eyes: Conjunctivae and EOM are normal.  Neck: Neck supple. No tracheal deviation present. No thyromegaly present.  No carotid bruit  Cardiovascular: Normal rate,  regular rhythm and normal heart sounds.   No murmur heard.  No edema. Pulmonary/Chest: Effort normal and breath sounds normal. No respiratory distress. She has no wheezes. She has no rales.  Breast: deferred   Abdominal: Soft. She exhibits no distension. There is no tenderness.  Lymphadenopathy: She has no cervical adenopathy.  Skin: Skin is warm and dry. She is not diaphoretic.  Psychiatric: She has a normal mood and affect. Her behavior is normal.        Assessment & Plan:   Physical exam: Screening blood work  ordered Exercise  golf, pickleball, [employer] Weight  working on weight loss Substance abuse  none   Screened for depression using the PHQ 9 scale.  No evidence of depression.     Screened for anxiety using GAD7 Scale, which does social anxiety which is related to work.  She feels she has the anxiety well controlled and does not need anything with it.     Shingrix today  Had both covid boosters.    S/p TAH  Health Maintenance  Topic Date Due   Zoster Vaccines- Shingrix (1 of 2) Never done   COVID-19 Vaccine (4 - Booster for Pfizer series) 11/03/2020   PAP SMEAR-Modifier  04/17/2021   INFLUENZA VACCINE  04/10/2021   MAMMOGRAM  06/30/2022   COLONOSCOPY (Pts 45-41yrs Insurance coverage will need to be confirmed)  02/04/2030   TETANUS/TDAP  03/18/2030   HPV VACCINES  Aged Out   Pneumococcal Vaccine 59-31 Years old  Discontinued   Hepatitis C Screening  Discontinued   HIV Screening  Discontinued           Tracie Problem List for Assessment and Plan of chronic medical problems.

## 2021-04-05 NOTE — Patient Instructions (Addendum)
Blood work was ordered.     Medications changes include :  none    Shinglex injection today.    Please followup in 1 year    Health Maintenance, Female Adopting a healthy lifestyle and getting preventive care are important in promoting health and wellness. Ask your health care provider about: The right schedule for you to have regular tests and exams. Things you can do on your own to prevent diseases and keep yourself healthy. What should I know about diet, weight, and exercise? Eat a healthy diet  Eat a diet that includes plenty of vegetables, fruits, low-fat dairy products, and lean protein. Do not eat a lot of foods that are high in solid fats, added sugars, or sodium.  Maintain a healthy weight Body mass index (BMI) is used to identify weight problems. It estimates body fat based on height and weight. Your health care provider can help determineyour BMI and help you achieve or maintain a healthy weight. Get regular exercise Get regular exercise. This is one of the most important things you can do for your health. Most adults should: Exercise for at least 150 minutes each week. The exercise should increase your heart rate and make you sweat (moderate-intensity exercise). Do strengthening exercises at least twice a week. This is in addition to the moderate-intensity exercise. Spend less time sitting. Even light physical activity can be beneficial. Watch cholesterol and blood lipids Have your blood tested for lipids and cholesterol at 59 years of age, then havethis test every 5 years. Have your cholesterol levels checked more often if: Your lipid or cholesterol levels are high. You are older than 59 years of age. You are at high risk for heart disease. What should I know about cancer screening? Depending on your health history and family history, you may need to have cancer screening at various ages. This may include screening for: Breast cancer. Cervical cancer. Colorectal  cancer. Skin cancer. Lung cancer. What should I know about heart disease, diabetes, and high blood pressure? Blood pressure and heart disease High blood pressure causes heart disease and increases the risk of stroke. This is more likely to develop in people who have high blood pressure readings, are of African descent, or are overweight. Have your blood pressure checked: Every 3-5 years if you are 80-71 years of age. Every year if you are 100 years old or older. Diabetes Have regular diabetes screenings. This checks your fasting blood sugar level. Have the screening done: Once every three years after age 4 if you are at a normal weight and have a low risk for diabetes. More often and at a younger age if you are overweight or have a high risk for diabetes. What should I know about preventing infection? Hepatitis B If you have a higher risk for hepatitis B, you should be screened for this virus. Talk with your health care provider to find out if you are at risk forhepatitis B infection. Hepatitis C Testing is recommended for: Everyone born from [birth year] through 1964. Anyone with known risk factors for hepatitis C. Sexually transmitted infections (STIs) Get screened for STIs, including gonorrhea and chlamydia, if: You are sexually active and are younger than 59 years of age. You are older than 59 years of age and your health care provider tells you that you are at risk for this type of infection. Your sexual activity has changed since you were last screened, and you are at increased risk for chlamydia or gonorrhea. Ask your health care  provider if you are at risk. Ask your health care provider about whether you are at high risk for HIV. Your health care provider may recommend a prescription medicine to help prevent HIV infection. If you choose to take medicine to prevent HIV, you should first get tested for HIV. You should then be tested every 3 months for as long as you are taking the  medicine. Pregnancy If you are about to stop having your period (premenopausal) and you may become pregnant, seek counseling before you get pregnant. Take 400 to 800 micrograms (mcg) of folic acid every day if you become pregnant. Ask for birth control (contraception) if you want to prevent pregnancy. Osteoporosis and menopause Osteoporosis is a disease in which the bones lose minerals and strength with aging. This can result in bone fractures. If you are 64 years old or older, or if you are at risk for osteoporosis and fractures, ask your health care provider if you should: Be screened for bone loss. Take a calcium or vitamin D supplement to lower your risk of fractures. Be given hormone replacement therapy (HRT) to treat symptoms of menopause. Follow these instructions at home: Lifestyle Do not use any products that contain nicotine or tobacco, such as cigarettes, e-cigarettes, and chewing tobacco. If you need help quitting, ask your health care provider. Do not use street drugs. Do not share needles. Ask your health care provider for help if you need support or information about quitting drugs. Alcohol use Do not drink alcohol if: Your health care provider tells you not to drink. You are pregnant, may be pregnant, or are planning to become pregnant. If you drink alcohol: Limit how much you use to 0-1 drink a day. Limit intake if you are breastfeeding. Be aware of how much alcohol is in your drink. In the [place] one drink equals one 12 oz bottle of beer (355 mL), one 5 oz glass of wine (148 mL), or one 1 oz glass of hard liquor (44 mL). General instructions Schedule regular health, dental, and eye exams. Stay current with your vaccines. Tell your health care provider if: You often feel depressed. You have ever been abused or do not feel safe at home. Summary Adopting a healthy lifestyle and getting preventive care are important in promoting health and wellness. Follow your health  care provider's instructions about healthy diet, exercising, and getting tested or screened for diseases. Follow your health care provider's instructions on monitoring your cholesterol and blood pressure. This information is not intended to replace advice given to you by your health care provider. Make sure you discuss any questions you have with your healthcare provider. Document Revised: 08/20/2018 Document Reviewed: 08/20/2018 Elsevier Patient Education  2022 [employer].

## 2021-04-06 ENCOUNTER — Encounter: Payer: Self-pay | Admitting: Internal Medicine

## 2021-04-06 ENCOUNTER — Ambulatory Visit (INDEPENDENT_AMBULATORY_CARE_PROVIDER_SITE_OTHER): Payer: 59 | Admitting: Internal Medicine

## 2021-04-06 ENCOUNTER — Other Ambulatory Visit: Payer: Self-pay

## 2021-04-06 VITALS — BP 116/80 | HR 67 | Temp 98.2°F | Ht 69.0 in | Wt 194.0 lb

## 2021-04-06 DIAGNOSIS — E7849 Other hyperlipidemia: Secondary | ICD-10-CM | POA: Diagnosis not present

## 2021-04-06 DIAGNOSIS — Z1331 Encounter for screening for depression: Secondary | ICD-10-CM | POA: Diagnosis not present

## 2021-04-06 DIAGNOSIS — E559 Vitamin D deficiency, unspecified: Secondary | ICD-10-CM | POA: Diagnosis not present

## 2021-04-06 DIAGNOSIS — Z23 Encounter for immunization: Secondary | ICD-10-CM

## 2021-04-06 DIAGNOSIS — Z0000 Encounter for general adult medical examination without abnormal findings: Secondary | ICD-10-CM | POA: Diagnosis not present

## 2021-04-06 DIAGNOSIS — R7303 Prediabetes: Secondary | ICD-10-CM | POA: Diagnosis not present

## 2021-04-06 LAB — CBC WITH DIFFERENTIAL/PLATELET
Basophils Absolute: 0 10*3/uL (ref 0.0–0.1)
Basophils Relative: 0.5 % (ref 0.0–3.0)
Eosinophils Absolute: 0.1 10*3/uL (ref 0.0–0.7)
Eosinophils Relative: 3.2 % (ref 0.0–5.0)
HCT: 40.4 % (ref 36.0–46.0)
Hemoglobin: 13.7 g/dL (ref 12.0–15.0)
Lymphocytes Relative: 36.6 % (ref 12.0–46.0)
Lymphs Abs: 1.7 10*3/uL (ref 0.7–4.0)
MCHC: 33.9 g/dL (ref 30.0–36.0)
MCV: 91.1 fl (ref 78.0–100.0)
Monocytes Absolute: 0.4 10*3/uL (ref 0.1–1.0)
Monocytes Relative: 8.1 % (ref 3.0–12.0)
Neutro Abs: 2.4 10*3/uL (ref 1.4–7.7)
Neutrophils Relative %: 51.6 % (ref 43.0–77.0)
Platelets: 200 10*3/uL (ref 150.0–400.0)
RBC: 4.43 Mil/uL (ref 3.87–5.11)
RDW: 13.1 % (ref 11.5–15.5)
WBC: 4.6 10*3/uL (ref 4.0–10.5)

## 2021-04-06 LAB — LIPID PANEL
Cholesterol: 176 mg/dL (ref 0–200)
HDL: 58.8 mg/dL (ref >39.00)
LDL Cholesterol: 104 mg/dL — ABNORMAL HIGH (ref 0–99)
NonHDL: 117.33
Total CHOL/HDL Ratio: 3
Triglycerides: 65 mg/dL (ref 0.0–149.0)
VLDL: 13 mg/dL (ref 0.0–40.0)

## 2021-04-06 LAB — COMPREHENSIVE METABOLIC PANEL
ALT: 21 U/L (ref 0–35)
AST: 16 U/L (ref 0–37)
Albumin: 4.6 g/dL (ref 3.5–5.2)
Alkaline Phosphatase: 70 U/L (ref 39–117)
BUN: 18 mg/dL (ref 6–23)
CO2: 28 mEq/L (ref 19–32)
Calcium: 9.6 mg/dL (ref 8.4–10.5)
Chloride: 102 mEq/L (ref 96–112)
Creatinine, Ser: 0.93 mg/dL (ref 0.40–1.20)
GFR: 67.47 mL/min (ref >60.00)
Glucose, Bld: 97 mg/dL (ref 70–99)
Potassium: 3.9 mEq/L (ref 3.5–5.1)
Sodium: 138 mEq/L (ref 135–145)
Total Bilirubin: 0.5 mg/dL (ref 0.2–1.2)
Total Protein: 7.4 g/dL (ref 6.0–8.3)

## 2021-04-06 LAB — TSH: TSH: 2.75 u[IU]/mL (ref 0.35–5.50)

## 2021-04-06 LAB — VITAMIN D 25 HYDROXY (VIT D DEFICIENCY, FRACTURES): VITD: 27.99 ng/mL — ABNORMAL LOW (ref 30.00–100.00)

## 2021-04-06 LAB — HEMOGLOBIN A1C: Hgb A1c MFr Bld: 5.7 % (ref 4.6–6.5)

## 2021-04-06 NOTE — Assessment & Plan Note (Addendum)
Chronic Check lipid panel, cmp, tsh Continue crestor 5 mg qd Regular exercise and healthy diet encouraged

## 2021-04-06 NOTE — Addendum Note (Signed)
Addended by: Marcina Millard on: 04/06/2021 04:59 PM   Modules accepted: Orders

## 2021-04-06 NOTE — Assessment & Plan Note (Signed)
Chronic Check a1c Low sugar / carb diet Stressed regular exercise  

## 2021-04-06 NOTE — Assessment & Plan Note (Signed)
Chronic Taking vitamin D daily Check vitamin D level  

## 2021-04-17 ENCOUNTER — Other Ambulatory Visit: Payer: Self-pay | Admitting: Internal Medicine

## 2021-04-17 DIAGNOSIS — Z1231 Encounter for screening mammogram for malignant neoplasm of breast: Secondary | ICD-10-CM

## 2021-05-16 ENCOUNTER — Encounter: Payer: Self-pay | Admitting: Internal Medicine

## 2021-05-22 NOTE — Progress Notes (Signed)
Subjective:    Patient ID: Tracie Hahn, female    DOB: 13-Apr-1961, 60 y.o.   MRN: [id number]  This visit occurred during the SARS-CoV-2 public health emergency.  Safety protocols were in place, including screening questions prior to the visit, additional usage of staff PPE, and extensive cleaning of exam room while observing appropriate contact time as indicated for disinfecting solutions.    HPI The patient is here for an acute visit for frequent urination.  She typically has mild nocturia-gets up to go to the bathroom 2 times at night.  Typically she is able to fall back to sleep pretty quickly.  She sometimes only urinates a small amount and other times large amount.  She has had a couple of episodes recently that was more excessive urination and she was concerned about that.  1 night she got up 6 times to go to the bathroom.  She denies any dysuria, hematuria.  Daytime urination she considers very normal and appropriate for the amount that she drinks.  Typically she does not drink in the evening after dinner.  She knows if she does have anything to drink, especially alcohol she will get up to go to the bathroom more.  Than 1 night that she got up 6 times she did have 1 alcoholic drink at 8:30 at night.  She went to bed around midnight that night.  She got up 6 times and was not sure why-she was concerned about that.  She just wanted to make sure there is nothing else she needed to be concerned about  She denies fevers, abdominal pain.   Medications and allergies reviewed with patient and updated if appropriate.  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 1 each 3   Fluorouracil 5 % SOLN Apply topically.     Multiple Vitamin (MULTIVITAMIN ADULT PO)      rosuvastatin (CRESTOR) 5 MG tablet TAKE 1 TABLET BY MOUTH  DAILY 90 tablet 3   No current facility-administered medications on file prior to visit.    Past Medical History:  Diagnosis Date   Cancer [facility])    endometrial   Ventral hernia     Past Surgical History:  Procedure Laterality Date   ABDOMINAL HYSTERECTOMY  2015   endometrial ca   BREAST BIOPSY Right    FIBROCYSTIC CHANGE    BREAST SURGERY  2014   right breast biopsy   COLONOSCOPY     DIAGNOSTIC LAPAROSCOPY  2011   right ovary removed   DILATION AND CURETTAGE OF UTERUS  4351,9994   DILITATION & CURRETTAGE/HYSTROSCOPY WITH VERSAPOINT RESECTION N/A 05/07/2014   Procedure: DILATATION & CURETTAGE/HYSTEROSCOPY WITH VERSAPOINT RESECTION;  Surgeon: Genia Del, MD;  Location: [facility];  Service: Gynecology;  Laterality: N/A;   EYE SURGERY  2008   lasik -bilateral   INSERTION OF MESH N/A 10/26/2016   Procedure:  INSERTION OF MESH;  Surgeon: Autumn Messing III, MD;  Location: [place];  Service: General;  Laterality: N/A;   left elbow surgery  08/2010   ulner nerve   oohorectomy Right 2011   VENTRAL HERNIA REPAIR N/A 10/26/2016   Procedure: LAPAROSCOPIC VENTRAL HERNIA;  Surgeon: Autumn Messing III, MD;  Location: [place];  Service: General;  Laterality: N/A;   WISDOM TOOTH EXTRACTION  1981    Social History   Socioeconomic History   Marital status: Married    Spouse name: Not on file   Number of children: Not on file   Years of education: Not on file   Highest education level: Not on file  Occupational History   Not  on file  Tobacco Use   Smoking status: Never   Smokeless tobacco: Never  Vaping Use   Vaping Use: Never used  Substance and Sexual Activity   Alcohol use: Yes    Alcohol/week: 3.0 - 4.0 standard drinks    Types: 3 - 4 Glasses of wine per week    Comment: 3-4   Drug use: No   Sexual activity: Yes    Partners: Male, Female    Comment: 1st intercourse- 42, partners- ?, 24 yrs current relationship   Other Topics Concern   Not on file  Social History Narrative   Not on file   Social Determinants of Health   Financial Resource Strain: Not on file  Food Insecurity: Not on file  Transportation Needs: Not on file  Physical Activity: Not on file  Stress: Not on file  Social Connections: Not on file    Family History  Problem Relation Age of Onset   Cancer Maternal Aunt 40       BRCA positive breast and ovarian   Breast cancer Maternal Aunt    Cancer Maternal Uncle 85       BRCA 1 & 2 positive female breast and prostate cancer   Breast cancer Maternal Uncle    Cancer Maternal Grandmother 55       ovarian cancer   Cancer Mother     Review of Systems  Constitutional:  Negative for fever.  Genitourinary:  Positive for frequency and urgency. Negative for dysuria and hematuria.      Objective:   Vitals:   05/23/21 0855  BP: 118/78  Pulse: 74  Temp: 98.6 F (37 C)  SpO2: 97%   BP Readings from Last 3 Encounters:  05/23/21 118/78  04/06/21 116/80  10/20/20 130/78   Wt Readings from Last 3 Encounters:  05/23/21 190 lb (86.2 kg)  04/06/21 194 lb (88 kg)  10/20/20 197 lb 6.4 oz (89.5 kg)   Body mass index is 28.06 kg/m.   Physical Exam    Constitutional:      General: She is not in acute distress.    Appearance: Normal appearance. She is not ill-appearing.  Skin:    General: Skin is warm and dry.        Assessment & Plan:    See Problem List for Assessment and Plan of chronic medical problems.

## 2021-05-23 ENCOUNTER — Other Ambulatory Visit: Payer: Self-pay

## 2021-05-23 ENCOUNTER — Encounter: Payer: Self-pay | Admitting: Internal Medicine

## 2021-05-23 ENCOUNTER — Ambulatory Visit (INDEPENDENT_AMBULATORY_CARE_PROVIDER_SITE_OTHER): Payer: 59 | Admitting: Internal Medicine

## 2021-05-23 VITALS — BP 118/78 | HR 74 | Temp 98.6°F | Ht 69.0 in | Wt 190.0 lb

## 2021-05-23 DIAGNOSIS — R350 Frequency of micturition: Secondary | ICD-10-CM | POA: Diagnosis not present

## 2021-05-23 LAB — POC URINALSYSI DIPSTICK (AUTOMATED)
Bilirubin, UA: NEGATIVE
Blood, UA: NEGATIVE
Glucose, UA: NEGATIVE
Ketones, UA: NEGATIVE
Leukocytes, UA: NEGATIVE
Nitrite, UA: NEGATIVE
Protein, UA: NEGATIVE
Spec Grav, UA: 1.015 (ref 1.010–1.025)
Urobilinogen, UA: 0.2 E.U./dL
pH, UA: 6 (ref 5.0–8.0)

## 2021-05-23 NOTE — Assessment & Plan Note (Signed)
Acute Nocturia-no real increased urination during the day Typical night is 2 times per night, which she finds very acceptable and she is able to get back to sleep easily Recently had a night where she was up 6 times in 1 night and the only change was having alcoholic drink at [id number] at night No concerning symptoms of dysuria, hematuria Urine dip here today normal No evidence of infection Mild nocturia which does not impact her quality of life.  Some of her nocturia could be related to sinking and she has to go the bathroom, but not actually really needing to go.  Since sometimes she only goes a small amount.  She does not feel that she needs anything for that Continue to hold liquids after dinner and avoid alcoholic beverages after dinner She will monitor for now She will also discuss with her gynecologist

## 2021-05-26 ENCOUNTER — Ambulatory Visit: Payer: 59 | Admitting: Internal Medicine

## 2021-06-22 ENCOUNTER — Other Ambulatory Visit: Payer: Self-pay

## 2021-06-22 ENCOUNTER — Encounter: Payer: Self-pay | Admitting: Obstetrics & Gynecology

## 2021-06-22 ENCOUNTER — Ambulatory Visit (INDEPENDENT_AMBULATORY_CARE_PROVIDER_SITE_OTHER): Payer: 59 | Admitting: Obstetrics & Gynecology

## 2021-06-22 ENCOUNTER — Other Ambulatory Visit (HOSPITAL_COMMUNITY)
Admission: RE | Admit: 2021-06-22 | Discharge: 2021-06-22 | Disposition: A | Discharge status: Home / Self Care | Payer: 59 | Source: Ambulatory Visit | Attending: Obstetrics & Gynecology | Admitting: Obstetrics & Gynecology

## 2021-06-22 VITALS — BP 110/74 | HR 68 | Resp 16 | Ht 68.75 in | Wt 192.0 lb

## 2021-06-22 DIAGNOSIS — Z90710 Acquired absence of both cervix and uterus: Secondary | ICD-10-CM

## 2021-06-22 DIAGNOSIS — R351 Nocturia: Secondary | ICD-10-CM

## 2021-06-22 DIAGNOSIS — Z8542 Personal history of malignant neoplasm of other parts of uterus: Secondary | ICD-10-CM

## 2021-06-22 DIAGNOSIS — Z1272 Encounter for screening for malignant neoplasm of vagina: Secondary | ICD-10-CM | POA: Diagnosis present

## 2021-06-22 DIAGNOSIS — Z01419 Encounter for gynecological examination (general) (routine) without abnormal findings: Secondary | ICD-10-CM | POA: Diagnosis present

## 2021-06-22 DIAGNOSIS — Z780 Asymptomatic menopausal state: Secondary | ICD-10-CM

## 2021-06-22 LAB — URINALYSIS, COMPLETE W/RFL CULTURE
Bacteria, UA: NONE SEEN /HPF
Bilirubin Urine: NEGATIVE
Casts: NONE SEEN /LPF
Crystals: NONE SEEN /HPF
Glucose, UA: NEGATIVE
Hgb urine dipstick: NEGATIVE
Hyaline Cast: NONE SEEN /LPF
Ketones, ur: NEGATIVE
Leukocyte Esterase: NEGATIVE
Nitrites, Initial: NEGATIVE
Protein, ur: NEGATIVE
RBC / HPF: NONE SEEN /HPF (ref 0–2)
Specific Gravity, Urine: 1.02 (ref 1.001–1.035)
WBC, UA: NONE SEEN /HPF (ref 0–5)
Yeast: NONE SEEN /HPF
pH: 7 (ref 5.0–8.0)

## 2021-06-22 LAB — NO CULTURE INDICATED

## 2021-06-22 NOTE — Progress Notes (Signed)
Tracie Hahn [birth year] [id number]   History:    60 y.o. G0 Married, wife   RP:  New (>3 yrs) patient presenting for annual gyn exam    HPI: Status post robotic TLH/BSO in 2015 for endometrial adenocarcinoma stage T1a.  Postmenopause on no hormone replacement therapy.  Strong family history of breast cancer and ovarian cancer, but mother was BR CA 1 and 2 negative.  No pelvic pain.  Had an episode of urinary frequency overnight, now urinates at night once or twice. Bowel movements normal.  Very fit and healthy nutrition.  Body mass index 28.56.  Health labs with Fam MD.  Tracie Hahn 01/2020.  BD normal in 2018.   Past medical history,surgical history, family history and social history were all reviewed and documented in the EPIC chart.  Gynecologic History No LMP recorded. Patient has had a hysterectomy.  Obstetric History OB History  Gravida Para Term Preterm AB Living  0 0 0 0 0 0  SAB IAB Ectopic Multiple Live Births  0 0 0 0 0     ROS: A ROS was performed and pertinent positives and negatives are included in the history.  GENERAL: No fevers or chills. HEENT: No change in vision, no earache, sore throat or sinus congestion. NECK: No pain or stiffness. CARDIOVASCULAR: No chest pain or pressure. No palpitations. PULMONARY: No shortness of breath, cough or wheeze. GASTROINTESTINAL: No abdominal pain, nausea, vomiting or diarrhea, melena or bright red blood per rectum. GENITOURINARY: No urinary frequency, urgency, hesitancy or dysuria. MUSCULOSKELETAL: No joint or muscle pain, no back pain, no recent trauma. DERMATOLOGIC: No rash, no itching, no lesions. ENDOCRINE: No polyuria, polydipsia, no heat or cold intolerance. No recent change in weight. HEMATOLOGICAL: No anemia or easy bruising or bleeding. NEUROLOGIC: No headache, seizures, numbness, tingling or weakness. PSYCHIATRIC: No depression, no loss of interest in normal activity or change in sleep pattern.     Exam:   BP 110/74    Pulse 68   Resp 16   Ht 5' 8.75" (1.746 m)   Wt 192 lb (87.1 kg)   BMI 28.56 kg/m   Body mass index is 28.56 kg/m.  General appearance : Well developed well nourished female. No acute distress HEENT: Eyes: no retinal hemorrhage or exudates,  Neck supple, trachea midline, no carotid bruits, no thyroidmegaly Lungs: Clear to auscultation, no rhonchi or wheezes, or rib retractions  Heart: Regular rate and rhythm, no murmurs or gallops Breast:Examined in sitting and supine position were symmetrical in appearance, no palpable masses or tenderness,  no skin retraction, no nipple inversion, no nipple discharge, no skin discoloration, no axillary or supraclavicular lymphadenopathy Abdomen: no palpable masses or tenderness, no rebound or guarding Extremities: no edema or skin discoloration or tenderness  Pelvic: Vulva: Normal             Vagina: No gross lesions or discharge.  Pap reflex done.  Cervix/Uterus absent  Adnexa  Without masses or tenderness  Anus: Normal  U/A Completely Negative   Assessment/Plan:  60 y.o. female for annual exam   1. Encounter for Papanicolaou smear of vagina as part of routine gynecological examination Gynecologic exam status post total laparoscopic hysterectomy with BSO.  Pap test done at the vaginal vault.  Breast exam normal.  We will repeat a screening mammogram now.  Colonoscopy May 2021.  Body mass index 28.56.  Continue with fitness and healthy nutrition.  Health labs with family physician. - Cytology - PAP( [place])  2.  Status post Robotic Total laparoscopic hysterectomy with BSO  3. History of endometrial cancer - Cytology - PAP( [place])  4. Postmenopause Well on no hormone replacement therapy.  Bone density in 2018 was normal.  Vitamin D supplements, calcium intake of 1.5 g/day and regular weightbearing physical activities to continue.  5. Nocturia U/A Negative. - Urinalysis,Complete w/RFL Culture  Other orders - VITAMIN D PO;  Take by mouth.   Tracie Bruins MD, 3:50 PM 06/22/2021

## 2021-06-23 ENCOUNTER — Encounter: Payer: Self-pay | Admitting: Internal Medicine

## 2021-06-26 LAB — CYTOLOGY - PAP: Diagnosis: NEGATIVE

## 2021-06-26 NOTE — Telephone Encounter (Signed)
Called pt to schedule her 2nd shingles vaccine.

## 2021-06-28 ENCOUNTER — Other Ambulatory Visit: Payer: Self-pay

## 2021-06-28 ENCOUNTER — Ambulatory Visit (INDEPENDENT_AMBULATORY_CARE_PROVIDER_SITE_OTHER): Payer: 59

## 2021-06-28 DIAGNOSIS — Z23 Encounter for immunization: Secondary | ICD-10-CM

## 2021-06-28 NOTE — Progress Notes (Signed)
2nd shingles given Please co-sign  Joniqua Sidle, CMA

## 2021-07-03 ENCOUNTER — Other Ambulatory Visit: Payer: Self-pay

## 2021-07-03 ENCOUNTER — Ambulatory Visit
Admission: RE | Admit: 2021-07-03 | Discharge: 2021-07-03 | Disposition: A | Discharge status: Home / Self Care | Payer: 59 | Source: Ambulatory Visit | Attending: Internal Medicine | Admitting: Internal Medicine

## 2021-07-03 DIAGNOSIS — Z1231 Encounter for screening mammogram for malignant neoplasm of breast: Secondary | ICD-10-CM

## 2021-10-20 ENCOUNTER — Encounter: Payer: Self-pay | Admitting: Nurse Practitioner

## 2021-10-20 ENCOUNTER — Ambulatory Visit (INDEPENDENT_AMBULATORY_CARE_PROVIDER_SITE_OTHER): Payer: 59 | Admitting: Nurse Practitioner

## 2021-10-20 ENCOUNTER — Other Ambulatory Visit: Payer: Self-pay

## 2021-10-20 VITALS — BP 116/68 | HR 86 | Temp 98.5°F | Ht 68.75 in | Wt 190.0 lb

## 2021-10-20 DIAGNOSIS — J40 Bronchitis, not specified as acute or chronic: Secondary | ICD-10-CM | POA: Diagnosis not present

## 2021-10-20 MED ORDER — AMOXICILLIN-POT CLAVULANATE 875-125 MG PO TABS: 1.0000 | ORAL_TABLET | Freq: Two times a day (BID) | ORAL | 0 refills | Status: DC

## 2021-10-20 MED ORDER — BENZONATATE 100 MG PO CAPS: 100.0000 mg | ORAL_CAPSULE | Freq: Two times a day (BID) | ORAL | 0 refills | Status: DC | PRN

## 2021-10-20 MED ORDER — PREDNISONE 20 MG PO TABS: 40.0000 mg | ORAL_TABLET | Freq: Every day | ORAL | 0 refills | Status: DC

## 2021-10-20 NOTE — Progress Notes (Signed)
Subjective:  Patient ID: Tracie Hahn, female    DOB: Dec 01, 1960  Age: 61 y.o. MRN: [id number]  CC:  Chief Complaint  Patient presents with   Cough    Dry cough, but a little productive in the morning. Has been going on since 2/2.       HPI  This patient arrives today for the above.  Symptoms started about 8 days ago.  She is taken 3 COVID-19 test all of which have been negative.  She is now coughing and does report shortness of breath. She is preparing for a trip to [place] next week, and would like some medication to help clear up her symptoms prior to this trip.  She has taken Mucinex over-the-counter which seemed to result in worsening of her cough.  She does sometimes get sputum up with her coughing but not a large volume and not with every time she is coughing.  She has been hydrating regularly.  She denies any fever.  She tells me that her symptoms have been improving slightly over the last day or so.  Past Medical History:  Diagnosis Date   Cancer [facility])    endometrial   Ventral hernia       Family History  Problem Relation Age of Onset   Cancer Maternal Aunt 40       BRCA positive breast and ovarian   Breast cancer Maternal Aunt    Cancer Maternal Uncle 78       BRCA 1 & 2 positive female breast and prostate cancer   Breast cancer Maternal Uncle    Cancer Maternal Grandmother 64       ovarian cancer   Cancer Mother     Social History   Social History Narrative   Not on file   Social History   Tobacco Use   Smoking status: Never   Smokeless tobacco: Never  Substance Use Topics   Alcohol use: Yes    Alcohol/week: 3.0 - 4.0 standard drinks    Types: 3 - 4 Glasses of wine per week    Comment: 3-4     Current Meds  Medication Sig   amoxicillin-clavulanate (AUGMENTIN) 875-125 MG tablet Take 1 tablet by mouth 2 (two) times daily.   benzonatate (TESSALON) 100 MG capsule Take 1 capsule (100 mg total) by mouth 2 (two) times daily as needed for  cough.   predniSONE (DELTASONE) 20 MG tablet Take 2 tablets (40 mg total) by mouth daily with breakfast.   rosuvastatin (CRESTOR) 5 MG tablet TAKE 1 TABLET BY MOUTH  DAILY   VITAMIN D PO Take by mouth.    ROS:  Review of Systems  Constitutional:  Negative for fever.  HENT:  Negative for congestion.   Respiratory:  Positive for cough and shortness of breath.   Cardiovascular:  Negative for chest pain.  Musculoskeletal:  Negative for myalgias.  Neurological:  Positive for headaches.    Objective:   Today's Vitals: BP 116/68 (BP Location: Right Arm, Patient Position: Sitting, Cuff Size: Normal)    Pulse 86    Temp 98.5 F (36.9 C) (Oral)    Ht 5' 8.75" (1.746 m)    Wt 190 lb (86.2 kg)    SpO2 98%    BMI 28.26 kg/m  Vitals with BMI 10/20/2021 06/22/2021 05/23/2021  Height 5' 8.75" 5' 8.75" $Remove'5\' 9"'[id number]$   Weight 190 lbs 192 lbs 190 lbs  BMI 28.27 82.64 15.83  Systolic 094 076 808  Diastolic 68 74  78  Pulse 86 68 74     Physical Exam Vitals reviewed.  Constitutional:      General: She is not in acute distress.    Appearance: Normal appearance.  HENT:     Head: Normocephalic and atraumatic.  Neck:     Vascular: No carotid bruit.  Cardiovascular:     Rate and Rhythm: Normal rate and regular rhythm.     Pulses: Normal pulses.     Heart sounds: Normal heart sounds.  Pulmonary:     Effort: Pulmonary effort is normal.     Breath sounds: Normal breath sounds.  Skin:    General: Skin is warm and dry.  Neurological:     General: No focal deficit present.     Mental Status: She is alert and oriented to person, place, and time.  Psychiatric:        Mood and Affect: Mood normal.        Behavior: Behavior normal.        Judgment: Judgment normal.         Assessment and Plan   1. Bronchitis      Plan: Symptoms sound viral in origin. We discussed that I would recommend she treat symptomatically to start. She is agreeable to this. Will prescribe tessalon perles for as needed  cough suppression, I encouraged her to hydrate with water throughout the day, she was told to continue taking her Mucinex as well as needed, and will prescribe short course of prednisone.  Because she will be traveling internationally in about 1 week I will prescribe Augmentin that she can fill if symptoms worsen, but she was encouraged not to fill this unless absolutely necessary.  She tells me she understands.  We also discussed possible side effects of prednisone and that I would recommend she take this with food in the morning.  We also discussed avoiding any NSAIDs while taking prednisone to avoid risk of GI bleed.  She reports her understanding.   Tests ordered No orders of the defined types were placed in this encounter.     Meds ordered this encounter  Medications   benzonatate (TESSALON) 100 MG capsule    Sig: Take 1 capsule (100 mg total) by mouth 2 (two) times daily as needed for cough.    Dispense:  30 capsule    Refill:  0    Order Specific Question:   Supervising Provider    Answer:   BURNS, Claudina Lick [[id number]]   predniSONE (DELTASONE) 20 MG tablet    Sig: Take 2 tablets (40 mg total) by mouth daily with breakfast.    Dispense:  10 tablet    Refill:  0    Order Specific Question:   Supervising Provider    Answer:   Binnie Rail [[id number]]   amoxicillin-clavulanate (AUGMENTIN) 875-125 MG tablet    Sig: Take 1 tablet by mouth 2 (two) times daily.    Dispense:  14 tablet    Refill:  0    Order Specific Question:   Supervising Provider    Answer:   Binnie Rail [id number]    Patient to follow-up as needed if symptoms worsen or do not improve.  Ailene Ards, NP

## 2021-10-23 ENCOUNTER — Encounter: Payer: Self-pay | Admitting: Internal Medicine

## 2021-10-23 MED ORDER — ALBUTEROL SULFATE HFA 108 (90 BASE) MCG/ACT IN AERS: 2.0000 | INHALATION_SPRAY | Freq: Four times a day (QID) | RESPIRATORY_TRACT | 0 refills | Status: DC | PRN

## 2021-10-23 NOTE — Addendum Note (Signed)
Addended by: Binnie Rail on: 10/23/2021 07:05 PM   Modules accepted: Orders

## 2022-02-26 ENCOUNTER — Other Ambulatory Visit: Payer: Self-pay | Admitting: Internal Medicine

## 2022-03-22 ENCOUNTER — Encounter: Payer: Self-pay | Admitting: Internal Medicine

## 2022-03-27 ENCOUNTER — Encounter: Payer: Self-pay | Admitting: Family Medicine

## 2022-03-27 ENCOUNTER — Ambulatory Visit (INDEPENDENT_AMBULATORY_CARE_PROVIDER_SITE_OTHER): Payer: 59

## 2022-03-27 ENCOUNTER — Ambulatory Visit (INDEPENDENT_AMBULATORY_CARE_PROVIDER_SITE_OTHER): Payer: 59 | Admitting: Family Medicine

## 2022-03-27 VITALS — BP 122/76 | HR 72 | Temp 98.2°F | Wt 190.0 lb

## 2022-03-27 DIAGNOSIS — M25571 Pain in right ankle and joints of right foot: Secondary | ICD-10-CM | POA: Insufficient documentation

## 2022-03-27 DIAGNOSIS — M25471 Effusion, right ankle: Secondary | ICD-10-CM | POA: Diagnosis not present

## 2022-03-27 NOTE — Assessment & Plan Note (Signed)
RLE is neurovascularly intact. No recent injury, endorses hx of old trauma. TTP over medial malleolus.  X ray ordered. Recommend RICE. Upcoming flight and I recommend compression socks, moving ankle often and taking NSAIDs and/or Tylenol prn.

## 2022-03-27 NOTE — Progress Notes (Signed)
Subjective:     Patient ID: Tracie Hahn, female    DOB: [birth year], 61 y.o.   MRN: [id number]  Chief Complaint  Patient presents with   Pain and swelling     In right ankle    HPI Patient is in today for a 1 wk history right ankle pain and swelling. No recent injury but has a hx of trauma in the past. Played pickleball and had aching afterwards.   Taking Advil prn  No other arthralgias or myalgias.   Denies calf pain or swelling, no claudication   Health Maintenance Due  Topic Date Due   COVID-19 Vaccine (4 - Pfizer series) 09/28/2020    Past Medical History:  Diagnosis Date   Cancer [facility])    endometrial   Ventral hernia     Past Surgical History:  Procedure Laterality Date   ABDOMINAL HYSTERECTOMY  2015   endometrial ca   BREAST BIOPSY Left 07/02/2019   FIBROCYSTIC CHANGE    BREAST BIOPSY Right 2014   BREAST SURGERY  2014   right breast biopsy   COLONOSCOPY     DIAGNOSTIC LAPAROSCOPY  2011   right ovary removed   DILATION AND CURETTAGE OF UTERUS  3250,0027   DILITATION & CURRETTAGE/HYSTROSCOPY WITH VERSAPOINT RESECTION N/A 05/07/2014   Procedure: DILATATION & CURETTAGE/HYSTEROSCOPY WITH VERSAPOINT RESECTION;  Surgeon: Genia Del, MD;  Location: [facility];  Service: Gynecology;  Laterality: N/A;   EYE SURGERY  2008   lasik -bilateral   INSERTION OF MESH N/A 10/26/2016   Procedure: INSERTION OF MESH;  Surgeon: Chevis Pretty III, MD;  Location: [facility] OR;  Service: General;  Laterality: N/A;   left elbow surgery  08/2010   ulner nerve   oohorectomy Right 2011   VENTRAL HERNIA REPAIR N/A 10/26/2016   Procedure: LAPAROSCOPIC VENTRAL HERNIA;  Surgeon: Chevis Pretty III, MD;  Location: [facility] OR;  Service: General;  Laterality: N/A;   WISDOM TOOTH EXTRACTION  1981    Family History  Problem Relation Age of Onset   Cancer Maternal Aunt 40       BRCA positive breast and ovarian   Breast cancer Maternal Aunt    Cancer Maternal Uncle 12       BRCA 1 & 2 positive  female breast and prostate cancer   Breast cancer Maternal Uncle    Cancer Maternal Grandmother 12       ovarian cancer   Cancer Mother     Social History   Socioeconomic History   Marital status: Married    Spouse name: Not on file   Number of children: Not on file   Years of education: Not on file   Highest education level: Not on file  Occupational History   Not on file  Tobacco Use   Smoking status: Never   Smokeless tobacco: Never  Vaping Use   Vaping Use: Never used  Substance and Sexual Activity   Alcohol use: Yes    Alcohol/week: 3.0 - 4.0 standard drinks of alcohol    Types: 3 - 4 Glasses of wine per week    Comment: 3-4   Drug use: No   Sexual activity: Yes    Partners: Female    Birth control/protection: Surgical    Comment: 1st intercourse- 16, partners- ?, 24 yrs current relationship , hysterectomy  Other Topics Concern   Not on file  Social History Narrative   Not on file   Social Determinants of Health   Financial Resource Strain: Not  on file  Food Insecurity: Not on file  Transportation Needs: Not on file  Physical Activity: Not on file  Stress: Not on file  Social Connections: Not on file  Intimate Partner Violence: Not on file    Outpatient Medications Prior to Visit  Medication Sig Dispense Refill   amoxicillin-clavulanate (AUGMENTIN) 875-125 MG tablet Take 1 tablet by mouth 2 (two) times daily. 14 tablet 0   clobetasol cream (TEMOVATE) 0.05 %      EPINEPHrine 0.3 mg/0.3 mL IJ SOAJ injection Inject 0.3 mLs (0.3 mg total) into the muscle as needed for anaphylaxis. 1 each 3   Fluorouracil 5 % SOLN Apply topically.     rosuvastatin (CRESTOR) 5 MG tablet Take 1 tablet (5 mg total) by mouth daily. Annual appt is due in must see provider for future refills 90 tablet 0   VITAMIN D PO Take by mouth.     albuterol (VENTOLIN HFA) 108 (90 Base) MCG/ACT inhaler Inhale 2 puffs into the lungs every 6 (six) hours as needed for wheezing or shortness of breath.  8 g 0   benzonatate (TESSALON) 100 MG capsule Take 1 capsule (100 mg total) by mouth 2 (two) times daily as needed for cough. 30 capsule 0   predniSONE (DELTASONE) 20 MG tablet Take 2 tablets (40 mg total) by mouth daily with breakfast. 10 tablet 0   No facility-administered medications prior to visit.    Allergies  Allergen Reactions   Shellfish Allergy     Itching, swelling, n/v    ROS     Objective:    Physical Exam Constitutional:      General: She is not in acute distress.    Appearance: She is not ill-appearing.  Cardiovascular:     Rate and Rhythm: Normal rate.  Pulmonary:     Effort: Pulmonary effort is normal.  Musculoskeletal:     Right lower leg: Normal. No tenderness. No edema.     Left lower leg: Normal.     Right ankle: Swelling present. Tenderness present over the medial malleolus. Normal range of motion. Normal pulse.     Right Achilles Tendon: Normal.     Left ankle: Normal.     Right foot: Normal range of motion and normal capillary refill. No swelling, tenderness or bony tenderness. Normal pulse.  Skin:    General: Skin is warm and dry.     Capillary Refill: Capillary refill takes less than 2 seconds.  Neurological:     General: No focal deficit present.     Mental Status: She is alert and oriented to person, place, and time.     Sensory: No sensory deficit.     Motor: No weakness.     Gait: Gait normal.  Psychiatric:        Mood and Affect: Mood normal.        Behavior: Behavior normal.     BP 122/76 (BP Location: Right Arm, Patient Position: Sitting, Cuff Size: Large)   Pulse 72   Temp 98.2 F (36.8 C) (Oral)   Wt 190 lb (86.2 kg)   SpO2 95%   BMI 28.26 kg/m  Wt Readings from Last 3 Encounters:  03/27/22 190 lb (86.2 kg)  10/20/21 190 lb (86.2 kg)  06/22/21 192 lb (87.1 kg)       Assessment & Plan:   Problem List Items Addressed This Visit       Other   Acute right ankle pain - Primary    RLE is neurovascularly  intact. No  recent injury, endorses hx of old trauma. TTP over medial malleolus.  X ray ordered. Recommend RICE. Upcoming flight and I recommend compression socks, moving ankle often and taking NSAIDs and/or Tylenol prn.       Relevant Orders   DG Ankle Complete Right (Completed)   Right ankle swelling    RLE is neurovascularly intact. No recent injury, endorses hx of old trauma. TTP over medial malleolus.  X ray ordered. Recommend RICE. Upcoming flight and I recommend compression socks, moving ankle often and taking NSAIDs and/or Tylenol prn.       Relevant Orders   DG Ankle Complete Right (Completed)    I am having Caren Griffins R. Herbster maintain her EPINEPHrine, clobetasol cream, Fluorouracil, VITAMIN D PO, benzonatate, predniSONE, amoxicillin-clavulanate, albuterol, and rosuvastatin.  No orders of the defined types were placed in this encounter.

## 2022-03-28 ENCOUNTER — Telehealth: Payer: Self-pay | Admitting: Internal Medicine

## 2022-03-28 NOTE — Telephone Encounter (Signed)
Pt spouse Tracie Hahn called and stated Tracie Hahn will be seeing her orthopedist on 04/13/22 and they advised her to reach out to [place] and get the xray results put on a CD so pt can bring it on day of appointment. Tracie Hahn stated she is available to come pick it up anytime.    Please advise   CB: [phone]

## 2022-03-29 NOTE — Telephone Encounter (Signed)
Message left for patient's spouse.  If she returns call please have her call [facility] Records Department @ [phone].  Number was left on her voicemail to call.

## 2022-04-10 ENCOUNTER — Other Ambulatory Visit: Payer: Self-pay | Admitting: Internal Medicine

## 2022-04-10 DIAGNOSIS — Z1231 Encounter for screening mammogram for malignant neoplasm of breast: Secondary | ICD-10-CM

## 2022-05-20 ENCOUNTER — Other Ambulatory Visit: Payer: Self-pay | Admitting: Internal Medicine

## 2022-05-29 ENCOUNTER — Encounter: Payer: Self-pay | Admitting: Internal Medicine

## 2022-05-29 DIAGNOSIS — U071 COVID-19: Secondary | ICD-10-CM | POA: Insufficient documentation

## 2022-05-29 NOTE — Progress Notes (Unsigned)
Virtual Visit via Video Note  I connected with Tracie Hahn on 05/29/22 at  2:40 PM EDT by a video enabled telemedicine application and verified that I am speaking with the correct person using two identifiers.   I discussed the limitations of evaluation and management by telemedicine and the availability of in person appointments. The patient expressed understanding and agreed to proceed.  Present for the visit:  Myself, Dr Billey Gosling, Eber Jones.  The patient is currently at home and I am in the office.    No referring provider.    History of Present Illness: She is here for an acute visit.  She has covid.     Her symptoms started She tested positive   ROS    Social History   Socioeconomic History   Marital status: Married    Spouse name: Not on file   Number of children: Not on file   Years of education: Not on file   Highest education level: Not on file  Occupational History   Not on file  Tobacco Use   Smoking status: Never   Smokeless tobacco: Never  Vaping Use   Vaping Use: Never used  Substance and Sexual Activity   Alcohol use: Yes    Alcohol/week: 3.0 - 4.0 standard drinks of alcohol    Types: 3 - 4 Glasses of wine per week    Comment: 3-4   Drug use: No   Sexual activity: Yes    Partners: Female    Birth control/protection: Surgical    Comment: 1st intercourse- 55, partners- ?, 24 yrs current relationship , hysterectomy  Other Topics Concern   Not on file  Social History Narrative   Not on file   Social Determinants of Health   Financial Resource Strain: Not on file  Food Insecurity: Not on file  Transportation Needs: Not on file  Physical Activity: Not on file  Stress: Not on file  Social Connections: Not on file     Observations/Objective: Appears well in NAD   Assessment and Plan:  See Problem List for Assessment and Plan of chronic medical problems.   Follow Up Instructions:    I discussed the assessment and  treatment plan with the patient. The patient was provided an opportunity to ask questions and all were answered. The patient agreed with the plan and demonstrated an understanding of the instructions.   The patient was advised to call back or seek an in-person evaluation if the symptoms worsen or if the condition fails to improve as anticipated.    Binnie Rail, MD

## 2022-05-30 ENCOUNTER — Telehealth (INDEPENDENT_AMBULATORY_CARE_PROVIDER_SITE_OTHER): Payer: 59 | Admitting: Internal Medicine

## 2022-05-30 DIAGNOSIS — U071 COVID-19: Secondary | ICD-10-CM

## 2022-05-30 MED ORDER — MOLNUPIRAVIR 200 MG PO CAPS: 4.0000 | ORAL_CAPSULE | Freq: Two times a day (BID) | ORAL | 0 refills | Status: AC

## 2022-05-30 NOTE — Assessment & Plan Note (Signed)
Acute Today is day 4 of symptoms Symptoms relatively mild She would like to take the antiviral-start molnupiravir 800 mg twice daily x5 days Can continue over-the-counter cold medications for symptom relief Rest, fluids Discussed quarantine recommendations Advised to call with any questions or concerns

## 2022-06-05 ENCOUNTER — Encounter: Payer: 59 | Admitting: Internal Medicine

## 2022-07-03 ENCOUNTER — Encounter: Payer: Self-pay | Admitting: Internal Medicine

## 2022-07-03 NOTE — Patient Instructions (Addendum)
Flu immunization administered today.     Blood work was ordered.   The lab is on the first floor.   Medications changes include :   none    Return in about 1 year (around 07/05/2023) for Physical Exam.    Health Maintenance, Female Adopting a healthy lifestyle and getting preventive care are important in promoting health and wellness. Ask your health care provider about: The right schedule for you to have regular tests and exams. Things you can do on your own to prevent diseases and keep yourself healthy. What should I know about diet, weight, and exercise? Eat a healthy diet  Eat a diet that includes plenty of vegetables, fruits, low-fat dairy products, and lean protein. Do not eat a lot of foods that are high in solid fats, added sugars, or sodium. Maintain a healthy weight Body mass index (BMI) is used to identify weight problems. It estimates body fat based on height and weight. Your health care provider can help determine your BMI and help you achieve or maintain a healthy weight. Get regular exercise Get regular exercise. This is one of the most important things you can do for your health. Most adults should: Exercise for at least 150 minutes each week. The exercise should increase your heart rate and make you sweat (moderate-intensity exercise). Do strengthening exercises at least twice a week. This is in addition to the moderate-intensity exercise. Spend less time sitting. Even light physical activity can be beneficial. Watch cholesterol and blood lipids Have your blood tested for lipids and cholesterol at 60 years of age, then have this test every 5 years. Have your cholesterol levels checked more often if: Your lipid or cholesterol levels are high. You are older than 60 years of age. You are at high risk for heart disease. What should I know about cancer screening? Depending on your health history and family history, you may need to have cancer screening at  various ages. This may include screening for: Breast cancer. Cervical cancer. Colorectal cancer. Skin cancer. Lung cancer. What should I know about heart disease, diabetes, and high blood pressure? Blood pressure and heart disease High blood pressure causes heart disease and increases the risk of stroke. This is more likely to develop in people who have high blood pressure readings or are overweight. Have your blood pressure checked: Every 3-5 years if you are 41-35 years of age. Every year if you are 51 years old or older. Diabetes Have regular diabetes screenings. This checks your fasting blood sugar level. Have the screening done: Once every three years after age 37 if you are at a normal weight and have a low risk for diabetes. More often and at a younger age if you are overweight or have a high risk for diabetes. What should I know about preventing infection? Hepatitis B If you have a higher risk for hepatitis B, you should be screened for this virus. Talk with your health care provider to find out if you are at risk for hepatitis B infection. Hepatitis C Testing is recommended for: Everyone born from [birth year] through 1964. Anyone with known risk factors for hepatitis C. Sexually transmitted infections (STIs) Get screened for STIs, including gonorrhea and chlamydia, if: You are sexually active and are younger than 60 years of age. You are older than 60 years of age and your health care provider tells you that you are at risk for this type of infection. Your sexual activity has changed since you were  last screened, and you are at increased risk for chlamydia or gonorrhea. Ask your health care provider if you are at risk. Ask your health care provider about whether you are at high risk for HIV. Your health care provider may recommend a prescription medicine to help prevent HIV infection. If you choose to take medicine to prevent HIV, you should first get tested for HIV. You should then be  tested every 3 months for as long as you are taking the medicine. Pregnancy If you are about to stop having your period (premenopausal) and you may become pregnant, seek counseling before you get pregnant. Take 400 to 800 micrograms (mcg) of folic acid every day if you become pregnant. Ask for birth control (contraception) if you want to prevent pregnancy. Osteoporosis and menopause Osteoporosis is a disease in which the bones lose minerals and strength with aging. This can result in bone fractures. If you are 70 years old or older, or if you are at risk for osteoporosis and fractures, ask your health care provider if you should: Be screened for bone loss. Take a calcium or vitamin D supplement to lower your risk of fractures. Be given hormone replacement therapy (HRT) to treat symptoms of menopause. Follow these instructions at home: Alcohol use Do not drink alcohol if: Your health care provider tells you not to drink. You are pregnant, may be pregnant, or are planning to become pregnant. If you drink alcohol: Limit how much you have to: 0-1 drink a day. Know how much alcohol is in your drink. In the [place] one drink equals one 12 oz bottle of beer (355 mL), one 5 oz glass of wine (148 mL), or one 1 oz glass of hard liquor (44 mL). Lifestyle Do not use any products that contain nicotine or tobacco. These products include cigarettes, chewing tobacco, and vaping devices, such as e-cigarettes. If you need help quitting, ask your health care provider. Do not use street drugs. Do not share needles. Ask your health care provider for help if you need support or information about quitting drugs. General instructions Schedule regular health, dental, and eye exams. Stay current with your vaccines. Tell your health care provider if: You often feel depressed. You have ever been abused or do not feel safe at home. Summary Adopting a healthy lifestyle and getting preventive care are important in  promoting health and wellness. Follow your health care provider's instructions about healthy diet, exercising, and getting tested or screened for diseases. Follow your health care provider's instructions on monitoring your cholesterol and blood pressure. This information is not intended to replace advice given to you by your health care provider. Make sure you discuss any questions you have with your health care provider. Document Revised: 01/16/2021 Document Reviewed: 01/16/2021 Elsevier Patient Education  [place].

## 2022-07-03 NOTE — Progress Notes (Unsigned)
Subjective:    Patient ID: Tracie Hahn, female    DOB: 1960-12-06, 61 y.o.   MRN: [id number]      HPI Tracie Hahn is here for a Physical exam.   Ear - ? Wax - not hearing as well.  Used old eardrops that she had on.  It helped-wondered if she could get more.  Wants to lose weight.  Not exercising as much as she should.  Traveling a lot.  Work is good.     Medications and allergies reviewed with patient and updated if appropriate.  Current Outpatient Medications on File Prior to Visit  Medication Sig Dispense Refill   clobetasol cream (TEMOVATE) 0.05 %      EPINEPHrine 0.3 mg/0.3 mL IJ SOAJ injection Inject 0.3 mLs (0.3 mg total) into the muscle as needed for anaphylaxis. 1 each 3   rosuvastatin (CRESTOR) 5 MG tablet TAKE 1 TABLET BY MOUTH DAILY 90 tablet 0   VITAMIN D PO Take by mouth.     No current facility-administered medications on file prior to visit.    Review of Systems  Constitutional:  Negative for fever.  HENT:  Positive for ear pain (right ear pain) and hearing loss (right ear).   Eyes:  Negative for visual disturbance.  Respiratory:  Negative for cough, shortness of breath and wheezing.   Cardiovascular:  Negative for chest pain, palpitations and leg swelling.  Gastrointestinal:  Negative for abdominal pain, blood in stool, constipation and diarrhea.       Gerd occ  Genitourinary:  Positive for urgency. Negative for dysuria.  Musculoskeletal:  Positive for arthralgias (age appropriate). Negative for back pain.  Skin:  Negative for rash.  Neurological:  Positive for headaches (rare). Negative for dizziness and light-headedness.  Psychiatric/Behavioral:  Negative for dysphoric mood. The patient is not nervous/anxious.        Objective:   Vitals:   07/04/22 1411  BP: 106/72  Pulse: 74  Temp: 98.2 F (36.8 C)  SpO2: 97%   Filed Weights   07/04/22 1411  Weight: 196 lb (88.9 kg)   Body mass index is 29.16 kg/m.  BP Readings from Last 3  Encounters:  07/04/22 106/72  03/27/22 122/76  10/20/21 116/68    Wt Readings from Last 3 Encounters:  07/04/22 196 lb (88.9 kg)  03/27/22 190 lb (86.2 kg)  10/20/21 190 lb (86.2 kg)       Physical Exam Constitutional: She appears well-developed and well-nourished. No distress.  HENT:  Head: Normocephalic and atraumatic.  Right Ear: External ear normal. Normal ear canal and TM Left Ear: External ear normal.  Normal ear canal and TM Mouth/Throat: Oropharynx is clear and moist.  Eyes: Conjunctivae normal.  Neck: Neck supple. No tracheal deviation present. No thyromegaly present.  No carotid bruit  Cardiovascular: Normal rate, regular rhythm and normal heart sounds.   No murmur heard.  No edema. Pulmonary/Chest: Effort normal and breath sounds normal. No respiratory distress. She has no wheezes. She has no rales.  Breast: deferred   Abdominal: Soft. She exhibits no distension. There is no tenderness.  Lymphadenopathy: She has no cervical adenopathy.  Skin: Skin is warm and dry. She is not diaphoretic.  Psychiatric: She has a normal mood and affect. Her behavior is normal.     Lab Results  Component Value Date   WBC 4.6 04/06/2021   HGB 13.7 04/06/2021   HCT 40.4 04/06/2021   PLT 200.0 04/06/2021   GLUCOSE 97 04/06/2021   CHOL  176 04/06/2021   TRIG 65.0 04/06/2021   HDL 58.80 04/06/2021   LDLCALC 104 (H) 04/06/2021   ALT 21 04/06/2021   AST 16 04/06/2021   NA 138 04/06/2021   K 3.9 04/06/2021   CL 102 04/06/2021   CREATININE 0.93 04/06/2021   BUN 18 04/06/2021   CO2 28 04/06/2021   TSH 2.75 04/06/2021   HGBA1C 5.7 04/06/2021         Assessment & Plan:   Physical exam: Screening blood work  ordered Exercise   irregular Weight  is ok Substance abuse  none   Reviewed recommended immunizations.  Flu immunization administered today.     Health Maintenance  Topic Date Due   COVID-19 Vaccine (4 - Pfizer series) 07/20/2022 (Originally 09/28/2020)    MAMMOGRAM  07/04/2023   PAP SMEAR-Modifier  06/22/2024   COLONOSCOPY (Pts 45-16yr Insurance coverage will need to be confirmed)  02/04/2030   TETANUS/TDAP  03/18/2030   INFLUENZA VACCINE  Completed   Zoster Vaccines- Shingrix  Completed   HPV VACCINES  Aged Out   Hepatitis C Screening  Discontinued   HIV Screening  Discontinued          See Problem List for Assessment and Plan of chronic medical problems.

## 2022-07-04 ENCOUNTER — Ambulatory Visit (INDEPENDENT_AMBULATORY_CARE_PROVIDER_SITE_OTHER): Payer: 59 | Admitting: Internal Medicine

## 2022-07-04 ENCOUNTER — Ambulatory Visit
Admission: RE | Admit: 2022-07-04 | Discharge: 2022-07-04 | Disposition: A | Discharge status: Home / Self Care | Payer: 59 | Source: Ambulatory Visit | Attending: Internal Medicine | Admitting: Internal Medicine

## 2022-07-04 VITALS — BP 106/72 | HR 74 | Temp 98.2°F | Ht 68.75 in | Wt 196.0 lb

## 2022-07-04 DIAGNOSIS — E7849 Other hyperlipidemia: Secondary | ICD-10-CM | POA: Diagnosis not present

## 2022-07-04 DIAGNOSIS — Z23 Encounter for immunization: Secondary | ICD-10-CM | POA: Diagnosis not present

## 2022-07-04 DIAGNOSIS — R7303 Prediabetes: Secondary | ICD-10-CM

## 2022-07-04 DIAGNOSIS — Z0000 Encounter for general adult medical examination without abnormal findings: Secondary | ICD-10-CM | POA: Diagnosis not present

## 2022-07-04 DIAGNOSIS — Z1231 Encounter for screening mammogram for malignant neoplasm of breast: Secondary | ICD-10-CM

## 2022-07-04 DIAGNOSIS — E559 Vitamin D deficiency, unspecified: Secondary | ICD-10-CM | POA: Diagnosis not present

## 2022-07-04 MED ORDER — NEOMYCIN-POLYMYXIN-HC 3.5-10000-1 OT SOLN: 4.0000 [drp] | Freq: Four times a day (QID) | OTIC | 0 refills | Status: DC

## 2022-07-04 NOTE — Assessment & Plan Note (Signed)
Chronic Taking vitamin d daily Check vitamin d level  

## 2022-07-04 NOTE — Assessment & Plan Note (Signed)
Chronic Check a1c Low sugar / carb diet Stressed regular exercise  

## 2022-07-04 NOTE — Assessment & Plan Note (Signed)
Chronic Check lipid panel, cbc, cmp, tsh Continue crestor 5 mg daily Regular exercise and healthy diet encouraged

## 2022-07-26 ENCOUNTER — Other Ambulatory Visit (INDEPENDENT_AMBULATORY_CARE_PROVIDER_SITE_OTHER): Payer: 59

## 2022-07-26 DIAGNOSIS — R7303 Prediabetes: Secondary | ICD-10-CM

## 2022-07-26 DIAGNOSIS — E559 Vitamin D deficiency, unspecified: Secondary | ICD-10-CM | POA: Diagnosis not present

## 2022-07-26 DIAGNOSIS — E7849 Other hyperlipidemia: Secondary | ICD-10-CM

## 2022-07-26 LAB — COMPREHENSIVE METABOLIC PANEL
ALT: 18 U/L (ref 0–35)
AST: 18 U/L (ref 0–37)
Albumin: 4.6 g/dL (ref 3.5–5.2)
Alkaline Phosphatase: 70 U/L (ref 39–117)
BUN: 20 mg/dL (ref 6–23)
CO2: 29 mEq/L (ref 19–32)
Calcium: 9.7 mg/dL (ref 8.4–10.5)
Chloride: 103 mEq/L (ref 96–112)
Creatinine, Ser: 0.96 mg/dL (ref 0.40–1.20)
GFR: 64.36 mL/min (ref >60.00)
Glucose, Bld: 108 mg/dL — ABNORMAL HIGH (ref 70–99)
Potassium: 4.5 mEq/L (ref 3.5–5.1)
Sodium: 138 mEq/L (ref 135–145)
Total Bilirubin: 0.6 mg/dL (ref 0.2–1.2)
Total Protein: 7.3 g/dL (ref 6.0–8.3)

## 2022-07-26 LAB — LIPID PANEL
Cholesterol: 208 mg/dL — ABNORMAL HIGH (ref 0–200)
HDL: 52.4 mg/dL (ref >39.00)
LDL Cholesterol: 137 mg/dL — ABNORMAL HIGH (ref 0–99)
NonHDL: 155.4
Total CHOL/HDL Ratio: 4
Triglycerides: 90 mg/dL (ref 0.0–149.0)
VLDL: 18 mg/dL (ref 0.0–40.0)

## 2022-07-26 LAB — CBC WITH DIFFERENTIAL/PLATELET
Basophils Absolute: 0 10*3/uL (ref 0.0–0.1)
Basophils Relative: 0.3 % (ref 0.0–3.0)
Eosinophils Absolute: 0.2 10*3/uL (ref 0.0–0.7)
Eosinophils Relative: 4.6 % (ref 0.0–5.0)
HCT: 41.4 % (ref 36.0–46.0)
Hemoglobin: 14 g/dL (ref 12.0–15.0)
Lymphocytes Relative: 29.6 % (ref 12.0–46.0)
Lymphs Abs: 1.2 10*3/uL (ref 0.7–4.0)
MCHC: 33.9 g/dL (ref 30.0–36.0)
MCV: 89.7 fl (ref 78.0–100.0)
Monocytes Absolute: 0.3 10*3/uL (ref 0.1–1.0)
Monocytes Relative: 7.7 % (ref 3.0–12.0)
Neutro Abs: 2.4 10*3/uL (ref 1.4–7.7)
Neutrophils Relative %: 57.8 % (ref 43.0–77.0)
Platelets: 225 10*3/uL (ref 150.0–400.0)
RBC: 4.62 Mil/uL (ref 3.87–5.11)
RDW: 13.2 % (ref 11.5–15.5)
WBC: 4.1 10*3/uL (ref 4.0–10.5)

## 2022-07-26 LAB — VITAMIN D 25 HYDROXY (VIT D DEFICIENCY, FRACTURES): VITD: 21.63 ng/mL — ABNORMAL LOW (ref 30.00–100.00)

## 2022-07-26 LAB — TSH: TSH: 2.95 u[IU]/mL (ref 0.35–5.50)

## 2022-07-26 LAB — HEMOGLOBIN A1C: Hgb A1c MFr Bld: 5.8 % (ref 4.6–6.5)

## 2022-08-14 ENCOUNTER — Other Ambulatory Visit: Payer: Self-pay | Admitting: Internal Medicine

## 2022-12-06 ENCOUNTER — Encounter: Payer: Self-pay | Admitting: Obstetrics & Gynecology

## 2022-12-06 ENCOUNTER — Other Ambulatory Visit (HOSPITAL_COMMUNITY)
Admission: RE | Admit: 2022-12-06 | Discharge: 2022-12-06 | Disposition: A | Discharge status: Home / Self Care | Payer: 59 | Source: Ambulatory Visit | Attending: Obstetrics & Gynecology | Admitting: Obstetrics & Gynecology

## 2022-12-06 ENCOUNTER — Ambulatory Visit (INDEPENDENT_AMBULATORY_CARE_PROVIDER_SITE_OTHER): Payer: 59 | Admitting: Obstetrics & Gynecology

## 2022-12-06 VITALS — BP 120/72 | HR 85 | Ht 68.5 in | Wt 197.0 lb

## 2022-12-06 DIAGNOSIS — Z01419 Encounter for gynecological examination (general) (routine) without abnormal findings: Secondary | ICD-10-CM | POA: Diagnosis not present

## 2022-12-06 DIAGNOSIS — Z8542 Personal history of malignant neoplasm of other parts of uterus: Secondary | ICD-10-CM | POA: Diagnosis not present

## 2022-12-06 DIAGNOSIS — N898 Other specified noninflammatory disorders of vagina: Secondary | ICD-10-CM

## 2022-12-06 DIAGNOSIS — Z1272 Encounter for screening for malignant neoplasm of vagina: Secondary | ICD-10-CM | POA: Insufficient documentation

## 2022-12-06 DIAGNOSIS — L292 Pruritus vulvae: Secondary | ICD-10-CM

## 2022-12-06 DIAGNOSIS — N760 Acute vaginitis: Secondary | ICD-10-CM | POA: Diagnosis not present

## 2022-12-06 DIAGNOSIS — Z90710 Acquired absence of both cervix and uterus: Secondary | ICD-10-CM

## 2022-12-06 DIAGNOSIS — Z780 Asymptomatic menopausal state: Secondary | ICD-10-CM | POA: Diagnosis not present

## 2022-12-06 LAB — WET PREP FOR TRICH, YEAST, CLUE

## 2022-12-06 MED ORDER — TINIDAZOLE 500 MG PO TABS: 1000.0000 mg | ORAL_TABLET | Freq: Two times a day (BID) | ORAL | 0 refills | Status: AC

## 2022-12-06 NOTE — Progress Notes (Signed)
Tracie Hahn [birth year] [id number]   History:    62 y.o. . G0 Married, wife   RP:  Established patient presenting for annual gyn exam    HPI: Status post robotic TLH/BSO in 2015 for endometrial adenocarcinoma stage T1a.  Postmenopause on no hormone replacement therapy. No pelvic pain. Pap Neg 06/2021.  Pap reflex today. Breasts normal.  Mammo Neg 06/2022.  Strong family history of breast cancer and ovarian cancer, but mother was BR CA 1 and 2 negative.  Pt c/o itching around the perineum at night. Bowel movements normal.  Very fit and healthy nutrition.  Body mass index 29.52.  Health labs with Fam MD.  [name] 01/2020.  BD normal in 2018.    Past medical history,surgical history, family history and social history were all reviewed and documented in the EPIC chart.  Gynecologic History No LMP recorded. Patient has had a hysterectomy.  Obstetric History OB History  Gravida Para Term Preterm AB Living  0 0 0 0 0 0  SAB IAB Ectopic Multiple Live Births  0 0 0 0 0     ROS: A ROS was performed and pertinent positives and negatives are included in the history. GENERAL: No fevers or chills. HEENT: No change in vision, no earache, sore throat or sinus congestion. NECK: No pain or stiffness. CARDIOVASCULAR: No chest pain or pressure. No palpitations. PULMONARY: No shortness of breath, cough or wheeze. GASTROINTESTINAL: No abdominal pain, nausea, vomiting or diarrhea, melena or bright red blood per rectum. GENITOURINARY: No urinary frequency, urgency, hesitancy or dysuria. MUSCULOSKELETAL: No joint or muscle pain, no back pain, no recent trauma. DERMATOLOGIC: No rash, no itching, no lesions. ENDOCRINE: No polyuria, polydipsia, no heat or cold intolerance. No recent change in weight. HEMATOLOGICAL: No anemia or easy bruising or bleeding. NEUROLOGIC: No headache, seizures, numbness, tingling or weakness. PSYCHIATRIC: No depression, no loss of interest in normal activity or change in sleep  pattern.     Exam:   BP 120/72   Pulse 85   Ht 5' 8.5" (1.74 m)   Wt 197 lb (89.4 kg)   SpO2 97%   BMI 29.52 kg/m   Body mass index is 29.52 kg/m.  General appearance : Well developed well nourished female. No acute distress HEENT: Eyes: no retinal hemorrhage or exudates,  Neck supple, trachea midline, no carotid bruits, no thyroidmegaly Lungs: Clear to auscultation, no rhonchi or wheezes, or rib retractions  Heart: Regular rate and rhythm, no murmurs or gallops Breast:Examined in sitting and supine position were symmetrical in appearance, no palpable masses or tenderness,  no skin retraction, no nipple inversion, no nipple discharge, no skin discoloration, no axillary or supraclavicular lymphadenopathy Abdomen: no palpable masses or tenderness, no rebound or guarding Extremities: no edema or skin discoloration or tenderness  Pelvic: Vulva: Perineal area with whitish inflammation.             Vagina: No gross lesions.  Increased discharge. Wet prep done.  Pap reflex done.  Cervix/Uterus absent  Adnexa  Without masses or tenderness  Anus: Normal  Wet prep:  Bacteria TNTC, WBC TNTC   Assessment/Plan:  62 y.o. female for annual exam   1. Encounter for Papanicolaou smear of vagina as part of routine gynecological examination Status post robotic TLH/BSO in 2015 for endometrial adenocarcinoma stage T1a.  Postmenopause on no hormone replacement therapy. No pelvic pain. Pap Neg 06/2021.  Pap reflex today. Breasts normal.  Mammo Neg 06/2022.  Strong family history of breast cancer and ovarian  cancer, but mother was BR CA 1 and 2 negative.  Pt c/o itching around the perineum at night. Bowel movements normal.  Very fit and healthy nutrition.  Body mass index 29.52.  Health labs with Fam MD.  [name] 01/2020.  BD normal in 2018. - Cytology - PAP( [facility])  2. History of endometrial cancer Status post robotic TLH/BSO in 2015 for endometrial adenocarcinoma stage T1a.  Postmenopause on no  hormone replacement therapy. No pelvic pain. Pap Neg 06/2021.  Pap reflex today. - Cytology - PAP( [place])  3. Status post Robotic Total laparoscopic hysterectomy with BSO  4. Postmenopause Postmenopause on no hormone replacement therapy. No pelvic pain.   5. Vulvar itching Pt c/o itching around the perineum at night. Vulvar exam showing mild whitish inflammation at the perineum.  Probable irritation from vaginal discharge.  Will treat for BV and patient will apply Clobetasol 0.05% ointment in a thin layer BID.  If no improvement, will schedule a visit for a Bx. - WET PREP FOR [place], YEAST, CLUE  6. Vaginal discharge Probable BV, will treat with Tinidazole. - WET PREP FOR [place], YEAST, CLUE  Other orders - triamcinolone cream (KENALOG) 0.1 %; Apply topically 2 (two) times daily. - tinidazole (TINDAMAX) 500 MG tablet; Take 2 tablets (1,000 mg total) by mouth 2 (two) times daily for 2 days.   Princess Bruins MD, 3:30 PM

## 2022-12-11 ENCOUNTER — Other Ambulatory Visit: Payer: Self-pay

## 2022-12-11 ENCOUNTER — Encounter: Payer: Self-pay | Admitting: Obstetrics & Gynecology

## 2022-12-11 MED ORDER — FLUCONAZOLE 150 MG PO TABS: ORAL_TABLET | ORAL | 0 refills | Status: DC

## 2022-12-13 ENCOUNTER — Encounter: Payer: Self-pay | Admitting: Obstetrics & Gynecology

## 2022-12-13 LAB — CYTOLOGY - PAP
Comment: NEGATIVE
Diagnosis: UNDETERMINED — AB
High risk HPV: NEGATIVE

## 2022-12-14 NOTE — Telephone Encounter (Signed)
Spoke w/ pt, advised her of pap results/recommendations and also advised her that hopefully there will be resolution of her sxs once rx is completed but if not, she should reach back out to [place] with an update. Pt voiced understanding and appreciation for call. Will route to provider for final review and close encounter.

## 2023-01-26 ENCOUNTER — Encounter: Payer: Self-pay | Admitting: Obstetrics & Gynecology

## 2023-01-29 ENCOUNTER — Other Ambulatory Visit: Payer: Self-pay

## 2023-01-29 DIAGNOSIS — L292 Pruritus vulvae: Secondary | ICD-10-CM

## 2023-01-29 NOTE — Telephone Encounter (Signed)
Pt scheduled for 02/08/2023. Will close encounter.

## 2023-02-08 ENCOUNTER — Encounter: Payer: Self-pay | Admitting: Obstetrics & Gynecology

## 2023-02-08 ENCOUNTER — Other Ambulatory Visit (HOSPITAL_COMMUNITY)
Admission: RE | Admit: 2023-02-08 | Discharge: 2023-02-08 | Disposition: A | Discharge status: Home / Self Care | Payer: 59 | Source: Ambulatory Visit | Attending: Obstetrics & Gynecology | Admitting: Obstetrics & Gynecology

## 2023-02-08 ENCOUNTER — Ambulatory Visit (INDEPENDENT_AMBULATORY_CARE_PROVIDER_SITE_OTHER): Payer: 59 | Admitting: Obstetrics & Gynecology

## 2023-02-08 VITALS — BP 122/80 | HR 70

## 2023-02-08 DIAGNOSIS — N909 Noninflammatory disorder of vulva and perineum, unspecified: Secondary | ICD-10-CM | POA: Diagnosis present

## 2023-02-08 DIAGNOSIS — L292 Pruritus vulvae: Secondary | ICD-10-CM | POA: Diagnosis not present

## 2023-02-08 NOTE — Addendum Note (Signed)
Addended by: Genia Del on: 02/08/2023 11:44 AM   Modules accepted: Orders

## 2023-02-08 NOTE — Progress Notes (Addendum)
    Tracie Hahn 1961-05-31 [id number]        62 y.o.  G0  RP: Perineal lesion for Bx  HPI: Vulvar exam on 12/06/22 showed mild whitish inflammation at the perineum.  Treated for BV and applied Clobetasol 0.05% ointment in a thin layer BID since.  Improved Sxs, but still having the irritated skin at the perineum.  Pap of vagina ASCUS/HPV HR Neg 12/06/22, will repeat Pap at 1 year.   OB History  Gravida Para Term Preterm AB Living  0 0 0 0 0 0  SAB IAB Ectopic Multiple Live Births  0 0 0 0 0    Past medical history,surgical history, problem list, medications, allergies, family history and social history were all reviewed and documented in the EPIC chart.   Directed ROS with pertinent positives and negatives documented in the history of present illness/assessment and plan.  Exam:  Vitals:   02/08/23 0929  BP: 122/80  Pulse: 70  SpO2: 98%   General appearance:  Normal  Informed written consent obtained for a perineal Bx: Hibiclens prep.  Local anesthesia with Lido 1%.  Punch #3 Bx at mid lower perineum.  Specimen sent to patho.  Closure with a separate stitch of Vicryl 4-0.  Good hemostasis.  No Cx.  Well tolerated.   Assessment/Plan:  62 y.o. G0P0000   1. Lesion of female perineum Vulvar exam on 12/06/22 showed mild whitish inflammation at the perineum.  Treated for BV and applied Clobetasol 0.05% ointment in a thin layer BID since.  Improved Sxs, but still having the irritated skin at the perineum.  Pap of vagina ASCUS/HPV HR Neg 12/06/22, will repeat Pap at 1 year. Punch #3 Bx at mid lower perineum.  Specimen sent to patho.  Closure with a separate stitch of Vicryl 4-0.  Good hemostasis.  No Cx.  Well tolerated. Post procedure precautions reviewed.  Management per results. - Surgical pathology( [place]/ [facility])  2. Vulvar itching  As above.  Genia Del MD, 10:08 AM 02/08/2023

## 2023-02-14 LAB — SURGICAL PATHOLOGY

## 2023-02-15 NOTE — Telephone Encounter (Signed)
Per ML: "Patho reviewed, see my recommendation. Dr. Elbert Ewings"

## 2023-05-31 ENCOUNTER — Other Ambulatory Visit: Payer: Self-pay | Admitting: Internal Medicine

## 2023-05-31 DIAGNOSIS — Z1231 Encounter for screening mammogram for malignant neoplasm of breast: Secondary | ICD-10-CM

## 2023-07-08 ENCOUNTER — Ambulatory Visit: Payer: 59

## 2023-07-09 ENCOUNTER — Ambulatory Visit
Admission: RE | Admit: 2023-07-09 | Discharge: 2023-07-09 | Disposition: A | Discharge status: Home / Self Care | Payer: 59 | Source: Ambulatory Visit | Attending: Internal Medicine | Admitting: Internal Medicine

## 2023-07-09 DIAGNOSIS — Z1231 Encounter for screening mammogram for malignant neoplasm of breast: Secondary | ICD-10-CM

## 2023-07-19 ENCOUNTER — Other Ambulatory Visit: Payer: Self-pay | Admitting: Internal Medicine

## 2023-07-23 ENCOUNTER — Telehealth: Payer: Self-pay

## 2023-07-31 ENCOUNTER — Telehealth: Payer: Self-pay | Admitting: Internal Medicine

## 2023-07-31 DIAGNOSIS — R7303 Prediabetes: Secondary | ICD-10-CM

## 2023-07-31 DIAGNOSIS — E7849 Other hyperlipidemia: Secondary | ICD-10-CM

## 2023-07-31 DIAGNOSIS — E559 Vitamin D deficiency, unspecified: Secondary | ICD-10-CM

## 2023-07-31 NOTE — Telephone Encounter (Signed)
Pt scheduled for CPE 12/3 and lab visit 11/27.  Please enter orders for lab visit.

## 2023-07-31 NOTE — Telephone Encounter (Signed)
Labs placed by Dr. Lawerance Bach today.

## 2023-07-31 NOTE — Telephone Encounter (Signed)
Labs ordered.

## 2023-08-07 ENCOUNTER — Other Ambulatory Visit (INDEPENDENT_AMBULATORY_CARE_PROVIDER_SITE_OTHER): Payer: 59

## 2023-08-07 DIAGNOSIS — E559 Vitamin D deficiency, unspecified: Secondary | ICD-10-CM | POA: Diagnosis not present

## 2023-08-07 DIAGNOSIS — E7849 Other hyperlipidemia: Secondary | ICD-10-CM

## 2023-08-07 DIAGNOSIS — R7303 Prediabetes: Secondary | ICD-10-CM | POA: Diagnosis not present

## 2023-08-07 LAB — LIPID PANEL
Cholesterol: 203 mg/dL — ABNORMAL HIGH (ref 0–200)
HDL: 49.2 mg/dL (ref >39.00)
LDL Cholesterol: 132 mg/dL — ABNORMAL HIGH (ref 0–99)
NonHDL: 153.56
Total CHOL/HDL Ratio: 4
Triglycerides: 108 mg/dL (ref 0.0–149.0)
VLDL: 21.6 mg/dL (ref 0.0–40.0)

## 2023-08-07 LAB — TSH: TSH: 3.11 u[IU]/mL (ref 0.35–5.50)

## 2023-08-07 LAB — COMPREHENSIVE METABOLIC PANEL
ALT: 33 U/L (ref 0–35)
AST: 21 U/L (ref 0–37)
Albumin: 4.3 g/dL (ref 3.5–5.2)
Alkaline Phosphatase: 69 U/L (ref 39–117)
BUN: 18 mg/dL (ref 6–23)
CO2: 26 meq/L (ref 19–32)
Calcium: 9.4 mg/dL (ref 8.4–10.5)
Chloride: 106 meq/L (ref 96–112)
Creatinine, Ser: 0.91 mg/dL (ref 0.40–1.20)
GFR: 68.13 mL/min (ref >60.00)
Glucose, Bld: 109 mg/dL — ABNORMAL HIGH (ref 70–99)
Potassium: 4 meq/L (ref 3.5–5.1)
Sodium: 140 meq/L (ref 135–145)
Total Bilirubin: 0.7 mg/dL (ref 0.2–1.2)
Total Protein: 6.7 g/dL (ref 6.0–8.3)

## 2023-08-07 LAB — VITAMIN D 25 HYDROXY (VIT D DEFICIENCY, FRACTURES): VITD: 19.88 ng/mL — ABNORMAL LOW (ref 30.00–100.00)

## 2023-08-07 LAB — CBC WITH DIFFERENTIAL/PLATELET
Basophils Absolute: 0 10*3/uL (ref 0.0–0.1)
Basophils Relative: 0.4 % (ref 0.0–3.0)
Eosinophils Absolute: 0.2 10*3/uL (ref 0.0–0.7)
Eosinophils Relative: 4.7 % (ref 0.0–5.0)
HCT: 40.9 % (ref 36.0–46.0)
Hemoglobin: 13.9 g/dL (ref 12.0–15.0)
Lymphocytes Relative: 34.6 % (ref 12.0–46.0)
Lymphs Abs: 1.4 10*3/uL (ref 0.7–4.0)
MCHC: 34 g/dL (ref 30.0–36.0)
MCV: 91.5 fL (ref 78.0–100.0)
Monocytes Absolute: 0.3 10*3/uL (ref 0.1–1.0)
Monocytes Relative: 8.1 % (ref 3.0–12.0)
Neutro Abs: 2.1 10*3/uL (ref 1.4–7.7)
Neutrophils Relative %: 52.2 % (ref 43.0–77.0)
Platelets: 195 10*3/uL (ref 150.0–400.0)
RBC: 4.47 Mil/uL (ref 3.87–5.11)
RDW: 13.4 % (ref 11.5–15.5)
WBC: 4 10*3/uL (ref 4.0–10.5)

## 2023-08-07 LAB — HEMOGLOBIN A1C: Hgb A1c MFr Bld: 5.9 % (ref 4.6–6.5)

## 2023-08-12 NOTE — Progress Notes (Unsigned)
Subjective:    Patient ID: Tracie Hahn, female    DOB: 1960/10/16, 62 y.o.   MRN: [id number]      HPI Merari is here for a Physical exam and her chronic medical problems.   Her mother died this past year.  She was able to take some time off of work and spend time with her.  She feels she is dealing with her mother's death well.  Maybe a little seasonal affective disorder   Nerve pain in arch of foot L > R-typically only occurs at night and left foot is worse than the right foot.  Not related to activity that day, footwear or anything else that she is able to determine.  Denies any cramping.  Medications and allergies reviewed with patient and updated if appropriate.  Current Outpatient Medications on File Prior to Visit  Medication Sig Dispense Refill   clobetasol cream (TEMOVATE) 0.05 %      EPINEPHrine 0.3 mg/0.3 mL IJ SOAJ injection Inject 0.3 mLs (0.3 mg total) into the muscle as needed for anaphylaxis. 1 each 3   rosuvastatin (CRESTOR) 5 MG tablet TAKE 1 TABLET BY MOUTH DAILY 90 tablet 0   triamcinolone cream (KENALOG) 0.1 % Apply topically 2 (two) times daily.     VITAMIN D PO Take by mouth.     No current facility-administered medications on file prior to visit.    Review of Systems  Constitutional:  Negative for fever.  Eyes:  Negative for visual disturbance.  Respiratory:  Negative for cough, shortness of breath and wheezing.   Cardiovascular:  Negative for chest pain, palpitations and leg swelling.  Gastrointestinal:  Negative for abdominal pain, blood in stool, constipation and diarrhea.       Occ GERD  Genitourinary:  Negative for dysuria.  Musculoskeletal:  Positive for arthralgias (age appropriate). Negative for back pain.  Skin:  Negative for rash.  Neurological:  Positive for headaches (rare). Negative for dizziness, light-headedness and numbness (tingling in left foot, burning pain in arch).  Psychiatric/Behavioral:  Negative for dysphoric mood.  The patient is not nervous/anxious.        Objective:   Vitals:   08/13/23 1320  BP: 112/74  Pulse: 60  Temp: 98.5 F (36.9 C)  SpO2: 98%   Filed Weights   08/13/23 1320  Weight: 198 lb (89.8 kg)   Body mass index is 29.67 kg/m.  BP Readings from Last 3 Encounters:  08/13/23 112/74  02/08/23 122/80  12/06/22 120/72    Wt Readings from Last 3 Encounters:  08/13/23 198 lb (89.8 kg)  12/06/22 197 lb (89.4 kg)  07/04/22 196 lb (88.9 kg)       Physical Exam Constitutional: She appears well-developed and well-nourished. No distress.  HENT:  Head: Normocephalic and atraumatic.  Right Ear: External ear normal. Normal ear canal and TM Left Ear: External ear normal.  Normal ear canal and TM Mouth/Throat: Oropharynx is clear and moist.  Eyes: Conjunctivae normal.  Neck: Neck supple. No tracheal deviation present. No thyromegaly present.  No carotid bruit  Cardiovascular: Normal rate, regular rhythm and normal heart sounds.   No murmur heard.  No edema. Pulmonary/Chest: Effort normal and breath sounds normal. No respiratory distress. She has no wheezes. She has no rales.  Breast: deferred   Abdominal: Soft. She exhibits no distension. There is no tenderness.  Lymphadenopathy: She has no cervical adenopathy.  Skin: Skin is warm and dry. She is not diaphoretic.  Psychiatric: She has a  normal mood and affect. Her behavior is normal.     Lab Results  Component Value Date   WBC 4.0 08/07/2023   HGB 13.9 08/07/2023   HCT 40.9 08/07/2023   PLT 195.0 08/07/2023   GLUCOSE 109 (H) 08/07/2023   CHOL 203 (H) 08/07/2023   TRIG 108.0 08/07/2023   HDL 49.20 08/07/2023   LDLCALC 132 (H) 08/07/2023   ALT 33 08/07/2023   AST 21 08/07/2023   NA 140 08/07/2023   K 4.0 08/07/2023   CL 106 08/07/2023   CREATININE 0.91 08/07/2023   BUN 18 08/07/2023   CO2 26 08/07/2023   TSH 3.11 08/07/2023   HGBA1C 5.9 08/07/2023         Assessment & Plan:   Physical exam: Screening  blood work  ordered Exercise  not as regular Weight  overweight-wants to work on weight loss. Substance abuse  none   Reviewed recommended immunizations.   Health Maintenance  Topic Date Due   INFLUENZA VACCINE  04/11/2023   COVID-19 Vaccine (4 - 2023-24 season) 08/29/2023 (Originally 05/12/2023)   MAMMOGRAM  07/08/2025   Colonoscopy  02/04/2030   DTaP/Tdap/Td (2 - Td or Tdap) 03/18/2030   Zoster Vaccines- Shingrix  Completed   HPV VACCINES  Aged Out   Hepatitis C Screening  Discontinued   HIV Screening  Discontinued          See Problem List for Assessment and Plan of chronic medical problems.

## 2023-08-12 NOTE — Patient Instructions (Addendum)
Flu immunization administered today.     Blood work was ordered.       Medications changes include :   None      Return in about 1 year (around 08/12/2024) for Physical Exam.   Health Maintenance, Female Adopting a healthy lifestyle and getting preventive care are important in promoting health and wellness. Ask your health care provider about: The right schedule for you to have regular tests and exams. Things you can do on your own to prevent diseases and keep yourself healthy. What should I know about diet, weight, and exercise? Eat a healthy diet  Eat a diet that includes plenty of vegetables, fruits, low-fat dairy products, and lean protein. Do not eat a lot of foods that are high in solid fats, added sugars, or sodium. Maintain a healthy weight Body mass index (BMI) is used to identify weight problems. It estimates body fat based on height and weight. Your health care provider can help determine your BMI and help you achieve or maintain a healthy weight. Get regular exercise Get regular exercise. This is one of the most important things you can do for your health. Most adults should: Exercise for at least 150 minutes each week. The exercise should increase your heart rate and make you sweat (moderate-intensity exercise). Do strengthening exercises at least twice a week. This is in addition to the moderate-intensity exercise. Spend less time sitting. Even light physical activity can be beneficial. Watch cholesterol and blood lipids Have your blood tested for lipids and cholesterol at 61 years of age, then have this test every 5 years. Have your cholesterol levels checked more often if: Your lipid or cholesterol levels are high. You are older than 61 years of age. You are at high risk for heart disease. What should I know about cancer screening? Depending on your health history and family history, you may need to have cancer screening at various ages. This may include  screening for: Breast cancer. Cervical cancer. Colorectal cancer. Skin cancer. Lung cancer. What should I know about heart disease, diabetes, and high blood pressure? Blood pressure and heart disease High blood pressure causes heart disease and increases the risk of stroke. This is more likely to develop in people who have high blood pressure readings or are overweight. Have your blood pressure checked: Every 3-5 years if you are 32-35 years of age. Every year if you are 59 years old or older. Diabetes Have regular diabetes screenings. This checks your fasting blood sugar level. Have the screening done: Once every three years after age 34 if you are at a normal weight and have a low risk for diabetes. More often and at a younger age if you are overweight or have a high risk for diabetes. What should I know about preventing infection? Hepatitis B If you have a higher risk for hepatitis B, you should be screened for this virus. Talk with your health care provider to find out if you are at risk for hepatitis B infection. Hepatitis C Testing is recommended for: Everyone born from [birth year] through 1964. Anyone with known risk factors for hepatitis C. Sexually transmitted infections (STIs) Get screened for STIs, including gonorrhea and chlamydia, if: You are sexually active and are younger than 61 years of age. You are older than 61 years of age and your health care provider tells you that you are at risk for this type of infection. Your sexual activity has changed since you were last screened, and you are  at increased risk for chlamydia or gonorrhea. Ask your health care provider if you are at risk. Ask your health care provider about whether you are at high risk for HIV. Your health care provider may recommend a prescription medicine to help prevent HIV infection. If you choose to take medicine to prevent HIV, you should first get tested for HIV. You should then be tested every 3 months for as  long as you are taking the medicine. Pregnancy If you are about to stop having your period (premenopausal) and you may become pregnant, seek counseling before you get pregnant. Take 400 to 800 micrograms (mcg) of folic acid every day if you become pregnant. Ask for birth control (contraception) if you want to prevent pregnancy. Osteoporosis and menopause Osteoporosis is a disease in which the bones lose minerals and strength with aging. This can result in bone fractures. If you are 71 years old or older, or if you are at risk for osteoporosis and fractures, ask your health care provider if you should: Be screened for bone loss. Take a calcium or vitamin D supplement to lower your risk of fractures. Be given hormone replacement therapy (HRT) to treat symptoms of menopause. Follow these instructions at home: Alcohol use Do not drink alcohol if: Your health care provider tells you not to drink. You are pregnant, may be pregnant, or are planning to become pregnant. If you drink alcohol: Limit how much you have to: 0-1 drink a day. Know how much alcohol is in your drink. In the [place] one drink equals one 12 oz bottle of beer (355 mL), one 5 oz glass of wine (148 mL), or one 1 oz glass of hard liquor (44 mL). Lifestyle Do not use any products that contain nicotine or tobacco. These products include cigarettes, chewing tobacco, and vaping devices, such as e-cigarettes. If you need help quitting, ask your health care provider. Do not use street drugs. Do not share needles. Ask your health care provider for help if you need support or information about quitting drugs. General instructions Schedule regular health, dental, and eye exams. Stay current with your vaccines. Tell your health care provider if: You often feel depressed. You have ever been abused or do not feel safe at home. Summary Adopting a healthy lifestyle and getting preventive care are important in promoting health and  wellness. Follow your health care provider's instructions about healthy diet, exercising, and getting tested or screened for diseases. Follow your health care provider's instructions on monitoring your cholesterol and blood pressure. This information is not intended to replace advice given to you by your health care provider. Make sure you discuss any questions you have with your health care provider. Document Revised: 01/16/2021 Document Reviewed: 01/16/2021 Elsevier Patient Education  2024 [employer].

## 2023-08-13 ENCOUNTER — Ambulatory Visit: Payer: 59 | Admitting: Internal Medicine

## 2023-08-13 ENCOUNTER — Encounter: Payer: 59 | Admitting: Internal Medicine

## 2023-08-13 ENCOUNTER — Encounter: Payer: Self-pay | Admitting: Internal Medicine

## 2023-08-13 VITALS — BP 112/74 | HR 60 | Temp 98.5°F | Ht 68.5 in | Wt 198.0 lb

## 2023-08-13 DIAGNOSIS — E7849 Other hyperlipidemia: Secondary | ICD-10-CM

## 2023-08-13 DIAGNOSIS — Z8542 Personal history of malignant neoplasm of other parts of uterus: Secondary | ICD-10-CM

## 2023-08-13 DIAGNOSIS — Z23 Encounter for immunization: Secondary | ICD-10-CM | POA: Diagnosis not present

## 2023-08-13 DIAGNOSIS — R7303 Prediabetes: Secondary | ICD-10-CM | POA: Diagnosis not present

## 2023-08-13 DIAGNOSIS — Z0000 Encounter for general adult medical examination without abnormal findings: Secondary | ICD-10-CM | POA: Diagnosis not present

## 2023-08-13 DIAGNOSIS — E559 Vitamin D deficiency, unspecified: Secondary | ICD-10-CM | POA: Diagnosis not present

## 2023-08-13 DIAGNOSIS — Z136 Encounter for screening for cardiovascular disorders: Secondary | ICD-10-CM

## 2023-08-13 NOTE — Assessment & Plan Note (Addendum)
Status post surgery-no additional treatment needed We will check CA-125

## 2023-08-13 NOTE — Assessment & Plan Note (Signed)
Chronic Lab Results  Component Value Date   HGBA1C 5.9 08/07/2023    Low sugar / carb diet Stressed regular exercise

## 2023-08-13 NOTE — Assessment & Plan Note (Signed)
Chronic Reviewed blood work Continue crestor 5 mg daily Regular exercise and healthy diet encouraged

## 2023-08-13 NOTE — Assessment & Plan Note (Signed)
Chronic Taking vitamin d daily Vitamin D level low with recent blood work She did already increase her vitamin D intake

## 2023-08-14 LAB — CA 125: CA 125: 11 U/mL (ref <35)

## 2023-08-22 ENCOUNTER — Ambulatory Visit (HOSPITAL_COMMUNITY)
Admission: RE | Admit: 2023-08-22 | Discharge: 2023-08-22 | Disposition: A | Discharge status: Home / Self Care | Payer: Self-pay | Source: Ambulatory Visit | Attending: Internal Medicine | Admitting: Internal Medicine

## 2023-08-22 DIAGNOSIS — Z136 Encounter for screening for cardiovascular disorders: Secondary | ICD-10-CM | POA: Insufficient documentation

## 2023-08-24 ENCOUNTER — Encounter: Payer: Self-pay | Admitting: Internal Medicine

## 2023-09-18 ENCOUNTER — Encounter: Payer: Self-pay | Admitting: Internal Medicine

## 2023-09-27 ENCOUNTER — Other Ambulatory Visit: Payer: Self-pay | Admitting: Internal Medicine

## 2023-12-10 ENCOUNTER — Ambulatory Visit: Payer: 59 | Admitting: Radiology

## 2023-12-23 ENCOUNTER — Ambulatory Visit (INDEPENDENT_AMBULATORY_CARE_PROVIDER_SITE_OTHER): Payer: 59 | Admitting: Radiology

## 2023-12-23 ENCOUNTER — Encounter: Payer: Self-pay | Admitting: Radiology

## 2023-12-23 VITALS — BP 124/84 | HR 64 | Ht 70.0 in | Wt 197.0 lb

## 2023-12-23 DIAGNOSIS — R6882 Decreased libido: Secondary | ICD-10-CM

## 2023-12-23 DIAGNOSIS — Z1331 Encounter for screening for depression: Secondary | ICD-10-CM | POA: Diagnosis not present

## 2023-12-23 DIAGNOSIS — Z01419 Encounter for gynecological examination (general) (routine) without abnormal findings: Secondary | ICD-10-CM | POA: Diagnosis not present

## 2023-12-23 DIAGNOSIS — N952 Postmenopausal atrophic vaginitis: Secondary | ICD-10-CM | POA: Diagnosis not present

## 2023-12-23 DIAGNOSIS — L280 Lichen simplex chronicus: Secondary | ICD-10-CM

## 2023-12-23 MED ORDER — ESTRADIOL 10 MCG VA TABS: 1.0000 | ORAL_TABLET | VAGINAL | 11 refills | Status: AC

## 2023-12-23 MED ORDER — CLOBETASOL PROPIONATE 0.05 % EX CREA: TOPICAL_CREAM | Freq: Two times a day (BID) | CUTANEOUS | 1 refills | Status: AC

## 2023-12-23 NOTE — Progress Notes (Signed)
   Tracie Hahn 1961/06/19 [id number]   History:  63 y.o. G0 presents for annual exam. Hyst 2015 endometrial cancer. Has a PCP. Retiring in May. Occasional vaginal d/c. Lichen simplex managed with clobetasol. Mother died of rectal/?vulvar cancer in October.  Gynecologic History Hysterectomy: 2016  Sexually active: yes. Female partner- c/o low libido  Health Maintenance Last Pap: 11/2022. Results were: ASCUS HPC negative Last mammogram: 2024. Results were: normal Last colonoscopy: 2021 Last Dexa: 2018 normal HRT use:no     12/23/2023    8:27 AM 10/20/2021    8:09 AM 04/06/2021    1:29 PM  Depression screen PHQ 2/9  Decreased Interest 0 0 0  Down, Depressed, Hopeless 0 0 0  PHQ - 2 Score 0 0 0  Altered sleeping   0  Tired, decreased energy   0  Change in appetite   0  Feeling bad or failure about yourself    0  Trouble concentrating   0  Moving slowly or fidgety/restless   0  Suicidal thoughts   0  PHQ-9 Score   0     Past medical history, past surgical history, family history and social history were all reviewed and documented in the EPIC chart.  ROS:  A ROS was performed and pertinent positives and negatives are included.  Exam:  Vitals:   12/23/23 0800  BP: 124/84  Pulse: 64  SpO2: 100%  Weight: 197 lb (89.4 kg)  Height: 5\' 10"  (1.778 m)   Body mass index is 28.27 kg/m.  General appearance:  Normal Thyroid:  Symmetrical, normal in size, without palpable masses or nodularity. Respiratory  Auscultation:  Clear without wheezing or rhonchi Cardiovascular  Auscultation:  Regular rate, without rubs, murmurs or gallops  Edema/varicosities:  Not grossly evident Abdominal  Soft,nontender, without masses, guarding or rebound.  Liver/spleen:  No organomegaly noted  Hernia:  None appreciated  Skin  Inspection:  Grossly normal Breasts: Examined lying and sitting.   Right: Without masses, retractions, nipple discharge or axillary adenopathy.   Left: Without  masses, retractions, nipple discharge or axillary adenopathy. Genitourinary   Inguinal/mons:  Normal without inguinal adenopathy  External genitalia:  Normal appearing vulva with no masses, tenderness, or lesions  BUS/Urethra/Skene's glands:  Normal  Vagina:  Normal appearing with normal color and discharge, no lesions. Atrophy moderate  Cervix:  absent  Uterus:  absent  Adnexa/parametria:     Rt: Normal in size, without masses or tenderness.   Lt: Normal in size, without masses or tenderness.  Anus and perineum: Normal   Limmie Ren, CMA present for exam  Assessment/Plan:   1. Well woman exam with routine gynecological exam (Primary) Pap 2027 ASCUS HPV neg 2024 likely from atrophy  2. Lichen simplex chronicus - clobetasol cream (TEMOVATE) 0.05 %; Apply topically 2 (two) times daily.  Dispense: 30 g; Refill: 1  3. Low libido - Testos,Total,Free and SHBG (Female)  4. Vaginal atrophy - Estradiol (YUVAFEM) 10 MCG TABS vaginal tablet; Place 1 tablet (10 mcg total) vaginally 2 (two) times a week. At bedtime  Dispense: 8 tablet; Refill: 11    Return in 1 year for annual or sooner prn.  Synetta Eves B WHNP-BC 8:27 AM 12/23/2023

## 2023-12-26 ENCOUNTER — Other Ambulatory Visit: Payer: Self-pay | Admitting: Internal Medicine

## 2023-12-28 LAB — TESTOS,TOTAL,FREE AND SHBG (FEMALE)
Free Testosterone: 2.3 pg/mL (ref 0.1–6.4)
Sex Hormone Binding: 21 nmol/L (ref 14–73)
Testosterone, Total, LC-MS-MS: 15 ng/dL (ref 2–45)

## 2024-05-09 ENCOUNTER — Encounter: Payer: Self-pay | Admitting: Internal Medicine

## 2024-05-24 ENCOUNTER — Encounter: Payer: Self-pay | Admitting: Internal Medicine

## 2024-05-25 MED ORDER — FLUCONAZOLE 150 MG PO TABS: 150.0000 mg | ORAL_TABLET | Freq: Once | ORAL | 0 refills | Status: DC

## 2024-05-26 ENCOUNTER — Other Ambulatory Visit: Payer: Self-pay | Admitting: Internal Medicine

## 2024-05-26 DIAGNOSIS — Z1231 Encounter for screening mammogram for malignant neoplasm of breast: Secondary | ICD-10-CM

## 2024-05-28 ENCOUNTER — Other Ambulatory Visit: Payer: Self-pay

## 2024-05-28 MED ORDER — FLUCONAZOLE 150 MG PO TABS: 150.0000 mg | ORAL_TABLET | Freq: Once | ORAL | 0 refills | Status: DC

## 2024-05-28 MED ORDER — FLUCONAZOLE 150 MG PO TABS: 150.0000 mg | ORAL_TABLET | Freq: Once | ORAL | 0 refills | Status: AC

## 2024-05-28 NOTE — Telephone Encounter (Signed)
 Please disregard this message. It looks like Dr. Geofm already addressed the Rx refill request.

## 2024-05-28 NOTE — Addendum Note (Signed)
 Addended by: GEOFM GLADE PARAS on: 05/28/2024 03:48 PM   Modules accepted: Orders

## 2024-07-06 ENCOUNTER — Ambulatory Visit: Payer: Self-pay

## 2024-07-06 NOTE — Telephone Encounter (Signed)
 FYI Only or Action Required?: FYI only for provider.  Patient was last seen in primary care on 08/13/2023 by Geofm Glade PARAS, MD.  Called Nurse Triage reporting Tick Removal.  Symptoms began today.  Interventions attempted: Nothing.  Symptoms are: stable.  Triage Disposition: See Physician Within 24 Hours  Patient/caregiver understands and will follow disposition?: Yes                           Copied from CRM #[id number]. Topic: Clinical - Red Word Triage >> Jul 06, 2024  2:16 PM Frederich PARAS wrote: [facility] that prompted transfer to Nurse Triage: tick bite  Pt  pulled a tick off her, its purple in midle and red on outside. No pain Reason for Disposition  Red ring or bull's-eye rash occurs at tick bite  Answer Assessment - Initial Assessment Questions 1. ATTACHED:  Is the tick still on the skin?  (e.g., yes, no, unsure)     No 2. ONSET - TICK STILL ATTACHED:  How long do you think the tick has been on your skin? (e.g., hours, days, unsure)  Note:  Is there a recent activity (camping, hiking) where the caller may have been exposed?     N/A 3. ONSET - TICK NOT STILL ATTACHED: If the tick has been removed, how long do you think the tick was attached before you removed it? (e.g., 5 hours, 2 days). When was this?     This morning around 4:30 AM 4. LOCATION: Where is the tick bite located? (e.g., arm, leg)     3-4 inches above navel  5. TYPE of TICK: Is it a wood tick or a deer tick? (e.g., deer tick, wood tick; unsure)     Small and black  6. SIZE of TICK: How big is the tick? (e.g., size of poppy seed, apple seed, watermelon seed; unsure) Note: Deer ticks can be the size of a poppy seed (nymph) or an apple seed (adult).       Small like a poppy seed 7. ENGORGED: Did the tick look flat or engorged (full, swollen)? (e.g., flat, engorged; unsure)     Flat 8. OTHER SYMPTOMS: Do you have any other symptoms? (e.g., fever, rash, redness at bite area, red  ring around bite)     Purple dot in center and small ring of red around bite, denies fever, denies rash, denies headache 9. PREGNANCY: Is there any chance you are pregnant? When was your last menstrual period?     N/A  Protocols used: Tick Bite-A-AH

## 2024-07-07 ENCOUNTER — Ambulatory Visit (INDEPENDENT_AMBULATORY_CARE_PROVIDER_SITE_OTHER): Admitting: Family Medicine

## 2024-07-07 ENCOUNTER — Encounter: Payer: Self-pay | Admitting: Family Medicine

## 2024-07-07 VITALS — BP 132/78 | HR 75 | Temp 97.6°F | Ht 70.0 in | Wt 197.0 lb

## 2024-07-07 DIAGNOSIS — S30861A Insect bite (nonvenomous) of abdominal wall, initial encounter: Secondary | ICD-10-CM | POA: Diagnosis not present

## 2024-07-07 DIAGNOSIS — W57XXXA Bitten or stung by nonvenomous insect and other nonvenomous arthropods, initial encounter: Secondary | ICD-10-CM | POA: Diagnosis not present

## 2024-07-07 NOTE — Progress Notes (Signed)
 Subjective:     Patient ID: Tracie Hahn, female    DOB: [birth year], 62 y.o.   MRN: [id number]  Chief Complaint  Patient presents with   Acute Visit    Tick bite, woke up Monday and saw it. Located on stomach     HPI  Discussed the use of AI scribe software for clinical note transcription with the patient, who gave verbal consent to proceed.  History of Present Illness SHYLAH Hahn is a 63 year old female who presents with concerns regarding a tick bite on her abdomen.  Tick bite - Tick bite on abdomen discovered after recent camping trip - Tick was small and not engorged. Most likely on patient for less than 12 hours.  - Tick removed with tweezers; patient believes the head was removed - Area of bite is slightly more erythematous today - Neosporin applied to the site - Concern for potential tick-borne illness  Systemic symptoms - No fever - No headache - No malaise - No joint pain - No bull's eye rash     Health Maintenance Due  Topic Date Due   Pneumococcal Vaccine: 50+ Years (1 of 1 - PCV) Never done    Past Medical History:  Diagnosis Date   Cancer (HCC)    endometrial   Ventral hernia     Past Surgical History:  Procedure Laterality Date   ABDOMINAL HYSTERECTOMY  2015   endometrial ca   BREAST BIOPSY Left 07/02/2019   FIBROCYSTIC CHANGE    BREAST BIOPSY Right 2014   BREAST SURGERY  2014   right breast biopsy   COLONOSCOPY     DIAGNOSTIC LAPAROSCOPY  2011   right ovary removed   DILATION AND CURETTAGE OF UTERUS  7989,7984   DILITATION & CURRETTAGE/HYSTROSCOPY WITH VERSAPOINT RESECTION N/A 05/07/2014   Procedure: DILATATION & CURETTAGE/HYSTEROSCOPY WITH VERSAPOINT RESECTION;  Surgeon: Percilla Burly, MD;  Location: [facility];  Service: Gynecology;  Laterality: N/A;   EYE SURGERY  2008   lasik -bilateral   INSERTION OF MESH N/A 10/26/2016   Procedure: INSERTION OF MESH;  Surgeon: Deward Null III, MD;  Location: [facility];  Service:  General;  Laterality: N/A;   left elbow surgery  08/2010   ulner nerve   oohorectomy Right 2011   VENTRAL HERNIA REPAIR N/A 10/26/2016   Procedure: LAPAROSCOPIC VENTRAL HERNIA;  Surgeon: Deward Null III, MD;  Location: [facility];  Service: General;  Laterality: N/A;   WISDOM TOOTH EXTRACTION  1981    Family History  Problem Relation Age of Onset   Hypertension Mother    Cancer Mother        vulvar & rectal   Cancer Maternal Aunt 40       BRCA positive breast and ovarian   Breast cancer Maternal Aunt    Cancer Maternal Uncle 9       BRCA 1 & 2 positive female breast and prostate cancer   Breast cancer Maternal Uncle    Cancer Maternal Grandmother 43       ovarian cancer    Social History   Socioeconomic History   Marital status: Married    Spouse name: Not on file   Number of children: Not on file   Years of education: Not on file   Highest education level: Bachelor's degree (e.g., BA, AB, BS)  Occupational History   Not on file  Tobacco Use   Smoking status: Never   Smokeless tobacco: Never  Vaping Use   Vaping  status: Never Used  Substance and Sexual Activity   Alcohol use: Yes    Alcohol/week: 3.0 - 4.0 standard drinks of alcohol    Types: 3 - 4 Glasses of wine per week    Comment: 3-4   Drug use: No   Sexual activity: Yes    Partners: Female    Birth control/protection: Surgical    Comment: 1st intercourse- 16, partners- ?, 24 yrs current relationship , hysterectomy  Other Topics Concern   Not on file  Social History Narrative   Not on file   Social Drivers of Health   Financial Resource Strain: Low Risk  (07/06/2024)   Overall Financial Resource Strain (CARDIA)    Difficulty of Paying Living Expenses: Not hard at all  Food Insecurity: No Food Insecurity (07/06/2024)   Hunger Vital Sign    Worried About Running Out of Food in the Last Year: Never true    Ran Out of Food in the Last Year: Never true  Transportation Needs: No Transportation Needs (07/06/2024)    PRAPARE - Administrator, Civil Service (Medical): No    Lack of Transportation (Non-Medical): No  Physical Activity: Sufficiently Active (07/06/2024)   Exercise Vital Sign    Days of Exercise per Week: 4 days    Minutes of Exercise per Session: 60 min  Stress: No Stress Concern Present (08/12/2023)   [employer] of Occupational Health - Occupational Stress Questionnaire    Feeling of Stress : Only a little  Social Connections: Socially Integrated (07/06/2024)   Social Connection and Isolation Panel    Frequency of Communication with Friends and Family: More than three times a week    Frequency of Social Gatherings with Friends and Family: Three times a week    Attends Religious Services: More than 4 times per year    Active Member of Clubs or Organizations: Yes    Attends Banker Meetings: More than 4 times per year    Marital Status: Married  Catering Manager Violence: Not on file    Outpatient Medications Prior to Visit  Medication Sig Dispense Refill   clobetasol  cream (TEMOVATE ) 0.05 % Apply topically 2 (two) times daily. 30 g 1   EPINEPHrine  0.3 mg/0.3 mL IJ SOAJ injection Inject 0.3 mLs (0.3 mg total) into the muscle as needed for anaphylaxis. 1 each 3   Estradiol  (YUVAFEM ) 10 MCG TABS vaginal tablet Place 1 tablet (10 mcg total) vaginally 2 (two) times a week. At bedtime 8 tablet 11   rosuvastatin  (CRESTOR ) 5 MG tablet TAKE 1 TABLET BY MOUTH DAILY 90 tablet 3   triamcinolone cream (KENALOG) 0.1 % Apply topically 2 (two) times daily.     VITAMIN D  PO Take by mouth.     No facility-administered medications prior to visit.    Allergies  Allergen Reactions   Shellfish Allergy     Itching, swelling, n/v    ROS Per HPI     Objective:    Physical Exam   BP 132/78   Pulse 75   Temp 97.6 F (36.4 C) (Temporal)   Ht 5' 10 (1.778 m)   Wt 197 lb (89.4 kg)   SpO2 96%   BMI 28.27 kg/m  Wt Readings from Last 3 Encounters:  07/07/24  197 lb (89.4 kg)  12/23/23 197 lb (89.4 kg)  08/13/23 198 lb (89.8 kg)       Assessment & Plan:   Problem List Items Addressed This Visit   None Visit Diagnoses  Tick bite of abdomen, initial encounter    -  Primary       Assessment and Plan Assessment & Plan Tick bite on abdomen Recent tick bite on the abdomen, less than 36 hours duration, with no engorgement. Low risk of Lyme disease due to short duration and lack of engorgement. No bull's eye rash present. Mild localized reaction with increased redness. No systemic symptoms such as fever, headache, malaise, or joint pain. - Monitor for development of bull's eye rash or systemic symptoms such as fever, headache, malaise, or joint pain. - Used Neosporin initially.  - Apply topical steroid for itching if needed. - Prescribe doxycycline twice a day for a week if symptoms develop or if there is significant concern. Prefers to hold off on antibiotic therapy unless absolutely necessary  - Send a MyChart message with a picture if the bite enlarges or if systemic symptoms develop.     I am having Montie SAUNDERS. Mennella maintain her EPINEPHrine , VITAMIN D  PO, triamcinolone cream, clobetasol  cream, Estradiol , and rosuvastatin .  No orders of the defined types were placed in this encounter.

## 2024-07-09 ENCOUNTER — Ambulatory Visit
Admission: RE | Admit: 2024-07-09 | Discharge: 2024-07-09 | Disposition: A | Discharge status: Home / Self Care | Source: Ambulatory Visit | Attending: Internal Medicine | Admitting: Internal Medicine

## 2024-07-09 DIAGNOSIS — Z1231 Encounter for screening mammogram for malignant neoplasm of breast: Secondary | ICD-10-CM

## 2024-07-14 ENCOUNTER — Other Ambulatory Visit: Payer: Self-pay | Admitting: Internal Medicine

## 2024-07-14 DIAGNOSIS — R928 Other abnormal and inconclusive findings on diagnostic imaging of breast: Secondary | ICD-10-CM

## 2024-07-24 ENCOUNTER — Ambulatory Visit
Admission: RE | Admit: 2024-07-24 | Discharge: 2024-07-24 | Disposition: A | Discharge status: Home / Self Care | Source: Ambulatory Visit | Attending: Internal Medicine | Admitting: Internal Medicine

## 2024-07-24 DIAGNOSIS — R928 Other abnormal and inconclusive findings on diagnostic imaging of breast: Secondary | ICD-10-CM

## 2024-09-28 NOTE — Progress Notes (Unsigned)
"       ° °  LILLETTE Ileana Collet, PhD, LAT, ATC acting as a scribe for Artist Lloyd, MD.  Tracie Hahn is a 64 y.o. female who presents to [facility] today for L knee pain x ***. Pt locates pain to ***  L Knee swelling: Mechanical symptoms: Aggravates: Treatments tried:  Pertinent review of systems: ***  Relevant historical information: ***   Exam:  There were no vitals taken for this visit. General: Well Developed, well nourished, and in no acute distress.   MSK: ***    Lab and Radiology Results No results found for this or any previous visit (from the past 72 hours). No results found.     Assessment and Plan: 64 y.o. female with ***   PDMP not reviewed this encounter. No orders of the defined types were placed in this encounter.  No orders of the defined types were placed in this encounter.    Discussed warning signs or symptoms. Please see discharge instructions. Patient expresses understanding.   ***  "

## 2024-09-29 ENCOUNTER — Other Ambulatory Visit: Payer: Self-pay

## 2024-09-29 ENCOUNTER — Ambulatory Visit: Admitting: Family Medicine

## 2024-09-29 ENCOUNTER — Ambulatory Visit

## 2024-09-29 ENCOUNTER — Encounter: Payer: Self-pay | Admitting: Family Medicine

## 2024-09-29 VITALS — BP 110/72 | HR 82 | Ht 70.0 in | Wt 197.0 lb

## 2024-09-29 DIAGNOSIS — M25562 Pain in left knee: Secondary | ICD-10-CM

## 2024-09-29 DIAGNOSIS — G8929 Other chronic pain: Secondary | ICD-10-CM

## 2024-09-29 NOTE — Patient Instructions (Addendum)
 Thank you for coming in today.   You received an injection today. Seek immediate medical attention if the joint becomes red, extremely painful, or is oozing fluid.   Please get an Xray today before you leave   Please use Voltaren gel (Generic Diclofenac Gel) up to 4x daily for pain as needed.  This is available over-the-counter as both the name brand Voltaren gel and the generic diclofenac gel.   See you back as needed.

## 2024-10-06 ENCOUNTER — Ambulatory Visit: Payer: Self-pay | Admitting: Family Medicine

## 2024-10-06 NOTE — Progress Notes (Signed)
 Left knee x-ray shows mild arthritis.
